# Patient Record
Sex: Male | Born: 1975
Health system: Southern US, Community
[De-identification: ages and names within clinical notes are randomized; demographics above are authoritative.]

## PROBLEM LIST (undated history)

## (undated) DIAGNOSIS — T7840XA Allergy, unspecified, initial encounter: Secondary | ICD-10-CM

## (undated) DIAGNOSIS — N35919 Unspecified urethral stricture, male, unspecified site: Secondary | ICD-10-CM

## (undated) DIAGNOSIS — F419 Anxiety disorder, unspecified: Secondary | ICD-10-CM

## (undated) HISTORY — DX: Allergy, unspecified, initial encounter: T78.40XA

## (undated) HISTORY — DX: Unspecified urethral stricture, male, unspecified site: N35.919

## (undated) HISTORY — DX: Anxiety disorder, unspecified: F41.9

---

## 2004-07-14 ENCOUNTER — Ambulatory Visit: Payer: Self-pay | Admitting: Internal Medicine

## 2004-08-24 ENCOUNTER — Ambulatory Visit (HOSPITAL_BASED_OUTPATIENT_CLINIC_OR_DEPARTMENT_OTHER): Admission: RE | Admit: 2004-08-24 | Discharge: 2004-08-24 | Payer: Self-pay | Admitting: Urology

## 2006-09-04 HISTORY — PX: URETHROPLASTY: SHX499

## 2006-09-04 HISTORY — PX: OTHER SURGICAL HISTORY: SHX169

## 2015-04-29 ENCOUNTER — Encounter: Payer: Self-pay | Admitting: Physician Assistant

## 2015-04-29 ENCOUNTER — Ambulatory Visit (INDEPENDENT_AMBULATORY_CARE_PROVIDER_SITE_OTHER): Payer: BLUE CROSS/BLUE SHIELD | Admitting: Physician Assistant

## 2015-04-29 VITALS — BP 126/74 | HR 96 | Temp 98.6°F | Resp 18 | Ht 73.0 in | Wt 195.0 lb

## 2015-04-29 DIAGNOSIS — F4321 Adjustment disorder with depressed mood: Secondary | ICD-10-CM

## 2015-04-29 DIAGNOSIS — F411 Generalized anxiety disorder: Secondary | ICD-10-CM | POA: Diagnosis not present

## 2015-04-29 MED ORDER — FINASTERIDE 5 MG PO TABS: 5.0000 mg | ORAL_TABLET | Freq: Every day | ORAL | Status: DC

## 2015-04-29 MED ORDER — ALPRAZOLAM 0.5 MG PO TABS: 0.5000 mg | ORAL_TABLET | Freq: Two times a day (BID) | ORAL | Status: DC | PRN

## 2015-04-29 NOTE — Progress Notes (Signed)
Assessment and Plan: Anxiety/stress reaction- will give xanax 0.5mg  #60 NR, follow up 1 month, may start medication at that time for OCD, call the office in 1-2 weeks or any issues.     HPI 39 y.o.male presents as new patient for anxiety, referred by Berdie Ogren. Wife has been diagnosed with terminal cancer and is in hospice, he has been 3 months at hospice or [facility] hospital. With the stress of this he is having a hard time sleeping, having panic attacks with racing heart beats, SOB, sweating. He has some OCD tendencies but has never had treatment. He is not interested in an every day pill but would like something for the panic attacks. 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   No past medical history on file.   Allergies not on file    No current outpatient prescriptions on file prior to visit.   No current facility-administered medications on file prior to visit.    ROS: all negative except above.   Physical Exam: Filed Weights   04/29/15 1535  Weight: 195 lb (88.451 kg)   BP 126/74 mmHg  Pulse 96  Temp(Src) 98.6 F (37 C) (Temporal)  Resp 18  Ht 6\' 1"  (1.854 m)  Wt 195 lb (88.451 kg)  BMI 25.73 kg/m2 General Appearance: Well nourished, in no apparent distress. Eyes: PERRLA, EOMs, conjunctiva no swelling or erythema Sinuses: No Frontal/maxillary tenderness ENT/Mouth: Ext aud canals clear, TMs without erythema, bulging. No erythema, swelling, or exudate on post pharynx.  Tonsils not swollen or erythematous. Hearing normal.  Neck: Supple, thyroid normal.  Respiratory: Respiratory effort normal, BS equal bilaterally without rales, rhonchi, wheezing or stridor.  Cardio: RRR with no MRGs. Brisk peripheral pulses without edema.  Abdomen: Soft, + BS.  Non tender, no guarding, rebound, hernias, masses. Lymphatics: Non tender without lymphadenopathy.  Musculoskeletal: Full ROM, 5/5 strength, normal gait.  Skin: Warm, dry without  rashes, lesions, ecchymosis.  Neuro: Cranial nerves intact. Normal muscle tone, no cerebellar symptoms. Sensation intact.  Psych: Awake and oriented X 3, normal affect, Insight and Judgment appropriate.     Vicie Mutters, PA-C 3:45 PM [facility]

## 2015-05-03 ENCOUNTER — Telehealth: Payer: Self-pay | Admitting: Physician Assistant

## 2015-05-03 MED ORDER — SERTRALINE HCL 50 MG PO TABS: 50.0000 mg | ORAL_TABLET | Freq: Every day | ORAL | Status: DC

## 2015-05-03 NOTE — Telephone Encounter (Signed)
Called to let us know that 2 of the 0.5mg  xanax helped with sleep. And he is interested in trying a low dose of zoloft to help with his anxiety during the day. Will send in zoloft 50mg  for him. Follow up 1 month

## 2015-05-18 ENCOUNTER — Other Ambulatory Visit: Payer: Self-pay | Admitting: Physician Assistant

## 2015-05-18 MED ORDER — ALPRAZOLAM 1 MG PO TABS
ORAL_TABLET | ORAL | Status: DC
Start: 2015-05-18 — End: 2015-05-31

## 2015-05-31 ENCOUNTER — Ambulatory Visit (INDEPENDENT_AMBULATORY_CARE_PROVIDER_SITE_OTHER): Payer: BLUE CROSS/BLUE SHIELD | Admitting: Physician Assistant

## 2015-05-31 ENCOUNTER — Encounter: Payer: Self-pay | Admitting: Physician Assistant

## 2015-05-31 VITALS — BP 118/76 | HR 88 | Temp 97.5°F | Resp 16 | Ht 73.0 in | Wt 191.8 lb

## 2015-05-31 DIAGNOSIS — F411 Generalized anxiety disorder: Secondary | ICD-10-CM

## 2015-05-31 DIAGNOSIS — R3915 Urgency of urination: Secondary | ICD-10-CM | POA: Diagnosis not present

## 2015-05-31 DIAGNOSIS — G4700 Insomnia, unspecified: Secondary | ICD-10-CM

## 2015-05-31 MED ORDER — ALPRAZOLAM 1 MG PO TABS: ORAL_TABLET | ORAL | Status: DC

## 2015-05-31 MED ORDER — TAMSULOSIN HCL 0.4 MG PO CAPS: 0.4000 mg | ORAL_CAPSULE | Freq: Every day | ORAL | Status: DC

## 2015-05-31 MED ORDER — SERTRALINE HCL 100 MG PO TABS: 100.0000 mg | ORAL_TABLET | Freq: Every day | ORAL | Status: DC

## 2015-05-31 NOTE — Progress Notes (Signed)
Assessment and Plan: OCD- increase zoloft to 100, call the office if not helping will try celexa.  Insomnia- From [facility], stop beers, no liquids 2 hours before bed, drink 64 oz during the day, add on flomax at night, if dizzy will try myrebetriq.  Anxiety- increase zoloft, suggest counseling, xanax PRN.   Declines labs.   HPI 39 y.o.male presents for follow up. Wife has passed since last seen. He is not having panic attacks, has been on zoloft 50 and taking xanax PRN for anxiety and sleep. Took several at the funeral of the xanax but other wise 1 at night. He just got a call from hospice grief counselor today and will make an appointment to see someone.  He is still waking up in the middle of the night but states that he is getting up to urinate. He drink 1-2 beers before bed.  He has seen a urologist in the past and has obstructive symptoms and a surgery in the past.  Having worsening OCD still with zoloft 50mg , will have to check doors/stove etc.  Has obstructive surgery in the past and is getting up to urinate in the middle of night.    No past medical history on file.   Allergies not on file    Current Outpatient Prescriptions on File Prior to Visit  Medication Sig Dispense Refill  . ALPRAZolam (XANAX) 1 MG tablet 1/2-1 tablet as needed twice daily for anxiety or sleep. 60 tablet 0  . finasteride (PROPECIA) 1 MG tablet Take 1 mg by mouth daily.    . finasteride (PROSCAR) 5 MG tablet Take 1 tablet (5 mg total) by mouth daily. 30 tablet 6  . sertraline (ZOLOFT) 50 MG tablet Take 1 tablet (50 mg total) by mouth daily. 30 tablet 2   No current facility-administered medications on file prior to visit.    ROS: all negative except above.   Physical Exam: Filed Weights   05/31/15 1606  Weight: 191 lb 12.8 oz (87 kg)   BP 118/76 mmHg  Pulse 88  Temp(Src) 97.5 F (36.4 C)  Resp 16  Ht 6\' 1"  (1.854 m)  Wt 191 lb 12.8 oz (87 kg)  BMI 25.31 kg/m2 General Appearance: Well nourished, in  no apparent distress. Eyes: PERRLA, EOMs, conjunctiva no swelling or erythema Sinuses: No Frontal/maxillary tenderness ENT/Mouth: Ext aud canals clear, TMs without erythema, bulging. No erythema, swelling, or exudate on post pharynx.  Tonsils not swollen or erythematous. Hearing normal.  Neck: Supple, thyroid normal.  Respiratory: Respiratory effort normal, BS equal bilaterally without rales, rhonchi, wheezing or stridor.  Cardio: RRR with no MRGs. Brisk peripheral pulses without edema.  Abdomen: Soft, + BS.  Non tender, no guarding, rebound, hernias, masses. Lymphatics: Non tender without lymphadenopathy.  Musculoskeletal: Full ROM, 5/5 strength, normal gait.  Skin: Warm, dry without rashes, lesions, ecchymosis.  Neuro: Cranial nerves intact. Normal muscle tone, no cerebellar symptoms. Sensation intact.  Psych: Awake and oriented X 3, normal affect, Insight and Judgment appropriate.     Vicie Mutters, PA-C 4:29 PM [facility]

## 2015-05-31 NOTE — Patient Instructions (Addendum)
Switch the zoloft to at night, take 50 mg tonight, and 100 tomorrow.   Drink AT LEAST 64 oz of water during the DAY  Do not drink anything 1-2 hours before bed  Continue the xanax PRN  Try the flomax at night and if this does not work OR if you get dizzy stop it and call me and I will leave samples for you to pick up  Also here is some information about good sleep hygiene.   Insomnia Insomnia is frequent trouble falling and/or staying asleep. Insomnia can be a long term problem or a short term problem. Both are common. Insomnia can be a short term problem when the wakefulness is related to a certain stress or worry. Long term insomnia is often related to ongoing stress during waking hours and/or poor sleeping habits. Overtime, sleep deprivation itself can make the problem worse. Every little thing feels more severe because you are overtired and your ability to cope is decreased. CAUSES   Stress, anxiety, and depression.  Poor sleeping habits.  Distractions such as TV in the bedroom.  Naps close to bedtime.  Engaging in emotionally charged conversations before bed.  Technical reading before sleep.  Alcohol and other sedatives. They may make the problem worse. They can hurt normal sleep patterns and normal dream activity.  Stimulants such as caffeine for several hours prior to bedtime.  Pain syndromes and shortness of breath can cause insomnia.  Exercise late at night.  Changing time zones may cause sleeping problems (jet lag). It is sometimes helpful to have someone observe your sleeping patterns. They should look for periods of not breathing during the night (sleep apnea). They should also look to see how long those periods last. If you live alone or observers are uncertain, you can also be observed at a sleep clinic where your sleep patterns will be professionally monitored. Sleep apnea requires a checkup and treatment. Give your caregivers your medical history. Give your  caregivers observations your family has made about your sleep.  SYMPTOMS   Not feeling rested in the morning.  Anxiety and restlessness at bedtime.  Difficulty falling and staying asleep. TREATMENT   Your caregiver may prescribe treatment for an underlying medical disorders. Your caregiver can give advice or help if you are using alcohol or other drugs for self-medication. Treatment of underlying problems will usually eliminate insomnia problems.  Medications can be prescribed for short time use. They are generally not recommended for lengthy use.  Over-the-counter sleep medicines are not recommended for lengthy use. They can be habit forming.  You can promote easier sleeping by making lifestyle changes such as:  Using relaxation techniques that help with breathing and reduce muscle tension.  Exercising earlier in the day.  Changing your diet and the time of your last meal. No night time snacks.  Establish a regular time to go to bed.  Counseling can help with stressful problems and worry.  Soothing music and white noise may be helpful if there are background noises you cannot remove.  Stop tedious detailed work at least one hour before bedtime. HOME CARE INSTRUCTIONS   Keep a diary. Inform your caregiver about your progress. This includes any medication side effects. See your caregiver regularly. Take note of:  Times when you are asleep.  Times when you are awake during the night.  The quality of your sleep.  How you feel the next day. This information will help your caregiver care for you.  Get out of bed if  you are still awake after 15 minutes. Read or do some quiet activity. Keep the lights down. Wait until you feel sleepy and go back to bed.  Keep regular sleeping and waking hours. Avoid naps.  Exercise regularly.  Avoid distractions at bedtime. Distractions include watching television or engaging in any intense or detailed activity like attempting to balance  the household checkbook.  Develop a bedtime ritual. Keep a familiar routine of bathing, brushing your teeth, climbing into bed at the same time each night, listening to soothing music. Routines increase the success of falling to sleep faster.  Use relaxation techniques. This can be using breathing and muscle tension release routines. It can also include visualizing peaceful scenes. You can also help control troubling or intruding thoughts by keeping your mind occupied with boring or repetitive thoughts like the old concept of counting sheep. You can make it more creative like imagining planting one beautiful flower after another in your backyard garden.  During your day, work to eliminate stress. When this is not possible use some of the previous suggestions to help reduce the anxiety that accompanies stressful situations. MAKE SURE YOU:   Understand these instructions.  Will watch your condition.  Will get help right away if you are not doing well or get worse. Document Released: 08/18/2000 Document Revised: 11/13/2011 Document Reviewed: 09/18/2007 [facility] Patient Information 2015 [place], [place]. This information is not intended to replace advice given to you by your health care provider. Make sure you discuss any questions you have with your health care provider.

## 2015-06-04 ENCOUNTER — Telehealth: Payer: Self-pay | Admitting: Physician Assistant

## 2015-06-04 MED ORDER — SCOPOLAMINE 1 MG/3DAYS TD PT72: 1.0000 | MEDICATED_PATCH | TRANSDERMAL | Status: DC

## 2015-06-04 MED ORDER — MECLIZINE HCL 32 MG PO TABS: 32.0000 mg | ORAL_TABLET | Freq: Three times a day (TID) | ORAL | Status: DC | PRN

## 2015-06-04 MED ORDER — ONDANSETRON HCL 4 MG PO TABS: 4.0000 mg | ORAL_TABLET | Freq: Three times a day (TID) | ORAL | Status: DC | PRN

## 2015-06-04 NOTE — Telephone Encounter (Signed)
Patient going deep sea fishing, sent in patches but if too expensive get the zofran for nausea and meclizine for sea sickness.

## 2015-06-04 NOTE — Telephone Encounter (Signed)
LVM for pt to pick up Rx.

## 2015-06-29 ENCOUNTER — Other Ambulatory Visit: Payer: Self-pay | Admitting: Physician Assistant

## 2015-06-29 MED ORDER — TRAZODONE HCL 50 MG PO TABS: ORAL_TABLET | ORAL | Status: DC

## 2015-08-11 ENCOUNTER — Ambulatory Visit (INDEPENDENT_AMBULATORY_CARE_PROVIDER_SITE_OTHER): Payer: BLUE CROSS/BLUE SHIELD | Admitting: Physician Assistant

## 2015-08-11 ENCOUNTER — Encounter: Payer: Self-pay | Admitting: Physician Assistant

## 2015-08-11 VITALS — BP 110/60 | HR 91 | Temp 98.1°F | Resp 16 | Ht 72.5 in | Wt 185.0 lb

## 2015-08-11 DIAGNOSIS — F4321 Adjustment disorder with depressed mood: Secondary | ICD-10-CM

## 2015-08-11 DIAGNOSIS — G4700 Insomnia, unspecified: Secondary | ICD-10-CM

## 2015-08-11 DIAGNOSIS — L219 Seborrheic dermatitis, unspecified: Secondary | ICD-10-CM

## 2015-08-11 DIAGNOSIS — N138 Other obstructive and reflux uropathy: Secondary | ICD-10-CM

## 2015-08-11 DIAGNOSIS — D225 Melanocytic nevi of trunk: Secondary | ICD-10-CM

## 2015-08-11 DIAGNOSIS — Z0000 Encounter for general adult medical examination without abnormal findings: Secondary | ICD-10-CM | POA: Diagnosis not present

## 2015-08-11 DIAGNOSIS — E559 Vitamin D deficiency, unspecified: Secondary | ICD-10-CM

## 2015-08-11 DIAGNOSIS — S86811A Strain of other muscle(s) and tendon(s) at lower leg level, right leg, initial encounter: Secondary | ICD-10-CM

## 2015-08-11 DIAGNOSIS — Z79899 Other long term (current) drug therapy: Secondary | ICD-10-CM | POA: Diagnosis not present

## 2015-08-11 DIAGNOSIS — Z13220 Encounter for screening for lipoid disorders: Secondary | ICD-10-CM

## 2015-08-11 DIAGNOSIS — Z1389 Encounter for screening for other disorder: Secondary | ICD-10-CM

## 2015-08-11 DIAGNOSIS — N401 Enlarged prostate with lower urinary tract symptoms: Secondary | ICD-10-CM

## 2015-08-11 DIAGNOSIS — R5383 Other fatigue: Secondary | ICD-10-CM

## 2015-08-11 DIAGNOSIS — F411 Generalized anxiety disorder: Secondary | ICD-10-CM

## 2015-08-11 MED ORDER — SILDENAFIL CITRATE 100 MG PO TABS: 100.0000 mg | ORAL_TABLET | ORAL | Status: DC | PRN

## 2015-08-11 MED ORDER — TRAZODONE HCL 150 MG PO TABS: ORAL_TABLET | ORAL | Status: DC

## 2015-08-11 MED ORDER — MELOXICAM 15 MG PO TABS: ORAL_TABLET | ORAL | Status: DC

## 2015-08-11 MED ORDER — CLOBETASOL PROPIONATE 0.05 % EX SHAM: MEDICATED_SHAMPOO | CUTANEOUS | Status: DC

## 2015-08-11 NOTE — Patient Instructions (Addendum)
Seborrheic Dermatitis  USE baby shampoo on your eyes Can also try over the counter hydrocortisone cream  Will send in a shampoo for your scalp, can use 2 x a week.   Seborrheic dermatitis involves pink or red skin with greasy, flaky scales. It usually occurs on the scalp, and it is often called dandruff. This condition may also affect the eyebrows, nose, ears, chest, and the bearded area of men's faces. It often occurs where skin has more oil (sebaceous) glands. It may come and go for no known reason, and it is often long-lasting (chronic). CAUSES The cause is not known. RISK FACTORS This condition is more like to develop in:  People who are stressed or tired.  People who have skin conditions, such as acne.  People who have certain conditions, such as:  HIV (human immunodeficiency virus).  AIDS (acquired immunodeficiency syndrome).  Parkinson disease.  An eating disorder.  Stroke.  Depression.  Epilepsy.  Alcoholism.  People who live in places that have extreme weather.  People who have a family history of seborrheic dermatitis.  People who use skin creams that are made with alcohol.  People who are 109-65 years old.  People who take certain medicines. SYMPTOMS Symptoms of this condition include:  Thick scales on the scalp.  Redness on the face or in the armpits.  Skin that is flaky. The flakes may be white or yellow.  Skin that seems oily or dry but is not helped with moisturizers.  Itching or burning in the affected areas. DIAGNOSIS This condition is diagnosed with a medical history and physical exam. A sample of your skin may be tested (skin biopsy). You may need to see a skin specialist (dermatologist). TREATMENT There is no cure for this condition, but treatment can help to manage the symptoms. Treatment may include:  Cortisone (steroid) ointments, creams, and lotions.  Over-the-counter or prescription shampoos. HOME CARE INSTRUCTIONS  Apply  over-the-counter and prescription medicines only as told by your health care provider.  Keep all follow-up visits as told by your health care provider. This is important.  Try to reduce your stress, such as with yoga or mediation. If you need help to reduce stress, ask your health care provider.  Shower or bathe as told by your health care provider.  Use any medicated shampoos as told by your health care provider. SEEK MEDICAL CARE IF:  Your symptoms do not improve with treatment.  Your symptoms get worse.  You have new symptoms.   This information is not intended to replace advice given to you by your health care provider. Make sure you discuss any questions you have with your health care provider.   Document Released: 08/21/2005 Document Revised: 05/12/2015 Document Reviewed: 01/06/2015 Elsevier Interactive Patient Education 2016 [employer]  Patellar Tendinitis With Rehab Tendinitis is inflammation of a tendon. Tendonitis of the tendon below the kneecap (patella) is known as patellar tendonitis. Patellar tendonitis is also called jumper's knee. Jumper's knee is a common cause of pain below the kneecap (infrapatellar). Jumper's knee may involve a tear (strain) in the ligament. Strains are classified into three categories. Grade 1 strains cause pain, but the tendon is not lengthened. Grade 2 strains include a lengthened ligament, due to the ligament being stretched or partially ruptured. With grade 2 strains there is still function, although function may be decreased. Grade 3 strains involve a complete tear of the tendon or muscle, and function is usually impaired. Patellar tendon strains are usually grade 1 or 2.  SYMPTOMS   Pain, tenderness, swelling, warmth, or redness over the patellar tendon (just below the kneecap).  Pain and loss of strength (sometimes), with forcefully straightening the knee (especially when jumping or rising from a seated or squatting position), or bending  the knee completely (squatting or kneeling).  Crackling sound (crepitation) when the tendon is moved or touched. CAUSES  Patellar tendonitis is caused by injury to the patellar tendon. The inflammation is the body's healing response. Common causes of injury include:  Stress from a sudden increase in intensity, frequency, or duration of training.  Overuse of the thigh muscles (quadriceps) and patellar tendon.  Direct hit (trauma) to the knee or patellar tendon. RISK INCREASES WITH:  Sports that require sudden, explosive quadriceps contraction, such as jumping, quick starts, or kicking.  Running sports, especially running down hills.  Poor strength and flexibility of the thigh and knee.  Flat feet. PREVENTION  Warm up and stretch properly before activity.  Allow for adequate recovery between workouts.  Maintain physical fitness:  Strength, flexibility, and endurance.  Cardiovascular fitness.  Protect the knee joint with taping, protective strapping, bracing, or elastic compression bandage.  Wear arch supports (orthotics). PROGNOSIS  If treated properly, patellar tendonitis usually heals within 6 weeks.  RELATED COMPLICATIONS   Longer healing time if not properly treated or if not given enough time to heal.  Recurring symptoms if activity is resumed too soon, with overuse, with a direct blow, or when using poor technique.  If untreated, tendon rupture requiring surgery. TREATMENT Treatment first involves the use of ice and medicine to reduce pain and inflammation. The use of strengthening and stretching exercises may help reduce pain with activity. These exercises may be performed at home or with a therapist. Serious cases of tendonitis may require restraining the knee for 10 to 14 days to prevent stress on the tendon and to promote healing. Crutches may be used (uncommon) until you can walk without a limp. For cases in which nonsurgical treatment is unsuccessful, surgery  may be advised to remove the inflamed tendon lining (sheath). Surgery is rare, and is only advised after at least 6 months of nonsurgical treatment. MEDICATION   If pain medicine is needed, nonsteroidal anti-inflammatory medicines (aspirin and ibuprofen), or other minor pain relievers (acetaminophen), are often advised.  Do not take pain medicine for 7 days before surgery.  Prescription pain relievers may be given if your caregiver thinks they are needed. Use only as directed and only as much as you need. HEAT AND COLD  Cold treatment (icing) should be applied for 10 to 15 minutes every 2 to 3 hours for inflammation and pain, and immediately after activity that aggravates your symptoms. Use ice packs or an ice massage.  Heat treatment may be used before performing stretching and strengthening activities prescribed by your caregiver, physical therapist, or athletic trainer. Use a heat pack or a warm water soak. SEEK MEDICAL CARE IF:  Symptoms get worse or do not improve in 2 weeks, despite treatment.  New, unexplained symptoms develop. (Drugs used in treatment may produce side effects.) EXERCISES RANGE OF MOTION (ROM) AND STRETCHING EXERCISES - Patellar Tendinitis (Jumper's Knee) These are some of the initial exercises with which you may start your rehabilitation program, until you see your caregiver again or until your symptoms are resolved. Remember:   Flexible tissue is more tolerant of the stresses placed on it during activities.  Each stretch should be held for 20 to 30 seconds.  A gentle stretching  sensation should be felt. STRETCH - Hamstrings, Supine  Lie on your back. Loop a belt or towel over the ball of your right / left foot.  Straighten your right / left knee and slowly pull on the belt to raise your leg. Do not allow the right / left knee to bend. Keep your opposite leg flat on the floor.  Raise the leg until you feel a gentle stretch behind your right / left knee or  thigh. Hold this position for __________ seconds. Repeat __________ times. Complete this stretch __________ times per day.  STRETCH - Hamstrings, Doorway  Lie on your back with your right / left leg extended and resting on the wall, and the opposite leg flat on the ground through the door. At first, position your bottom farther away from the wall.  Keep your right / left knee straight. If you feel a stretch behind your knee or thigh, hold this position for __________ seconds.  If you do not feel a stretch, scoot your bottom closer to the door, and hold __________ seconds. Repeat __________ times. Complete this stretch __________ times per day.  STRETCH - Hamstrings, Standing  Stand or sit and extend your right / left leg, placing your foot on a chair or foot stool.  Keep a slight arch in your low back and your hips straight forward.  Lead with your chest and lean forward at the waist until you feel a gentle stretch in the back of your right / left knee or thigh. (When done correctly, this exercise requires leaning only a small distance.)  Hold this position for __________ seconds. Repeat __________ times. Complete this stretch __________ times per day. STRETCH - Adductors, Lunge  While standing, spread your legs, with your right / left leg behind you.  Lean away from your right / left leg by bending your opposite knee. You may rest your hands on your thigh for balance.  You should feel a stretch in your right / left inner thigh. Hold for __________ seconds. Repeat __________ times. Complete this exercise __________ times per day.  STRENGTHENING EXERCISES - Patellar Tendinitis (Jumper's Knee) These exercises may help you when beginning to rehabilitate your injury. They may resolve your symptoms with or without further involvement from your physician, physical therapist or athletic trainer. While completing these exercises, remember:   Muscles can gain both the endurance and the strength  needed for everyday activities through controlled exercises.  Complete these exercises as instructed by your physician, physical therapist or athletic trainer. Increase the resistance and repetitions only as guided by your caregiver. STRENGTH - Quadriceps, Isometrics  Lie on your back with your right / left leg extended and your opposite knee bent.  Gradually tense the muscles in the front of your right / left thigh. You should see either your kneecap slide up toward your hip or increased dimpling just above the knee. This motion will push the back of the knee down toward the floor, mat, or bed on which you are lying.  Hold the muscle as tight as you can, without increasing your pain, for __________ seconds.  Relax the muscles slowly and completely in between each repetition. Repeat __________ times. Complete this exercise __________ times per day.  STRENGTH - Quadriceps, Short Arcs  Lie on your back. Place a __________ inch towel roll under your right / left knee, so that the knee bends slightly.  Raise only your lower leg by tightening the muscles in the front of your thigh. Do  not allow your thigh to rise.  Hold this position for __________ seconds. Repeat __________ times. Complete this exercise __________ times per day.  OPTIONAL ANKLE WEIGHTS: Begin with ____________________, but DO NOT exceed ____________________. Increase in 1 pound/ 0.5 kilogram increments. STRENGTH - Quadriceps, Straight Leg Raises  Quality counts! Watch for signs that the quadriceps muscle is working, to be sure you are strengthening the correct muscles and not "cheating" by substituting with healthier muscles.  Lay on your back with your right / left leg extended and your opposite knee bent.  Tense the muscles in the front of your right / left thigh. You should see either your kneecap slide up or increased dimpling just above the knee. Your thigh may even shake a bit.  Tighten these muscles even more and  raise your leg 4 to 6 inches off the floor. Hold for __________ seconds.  Keeping these muscles tense, lower your leg.  Relax the muscles slowly and completely between each repetition. Repeat __________ times. Complete this exercise __________ times per day.  STRENGTH - Quadriceps, Squats  Stand in a door frame so that your feet and knees are in line with the frame.  Use your hands for balance, not support, on the frame.  Slowly lower your weight, bending at the hips and knees. Keep your lower legs upright so that they are parallel with the door frame. Squat only within the range that does not increase your knee pain. Never let your hips drop below your knees.  Slowly return upright, pushing with your legs, not pulling with your hands. Repeat __________ times. Complete this exercise __________ times per day.  STRENGTH - Quadriceps, Step-Downs  Stand on the edge of a step stool or stair. Be prepared to use a countertop or wall for balance, if needed.  Keeping your right / left knee directly over the middle of your foot, slowly touch your opposite heel to the floor or lower step. Do not go all the way to the floor if your knee pain increases; just go as far as you can without increased discomfort. Use your right / left leg muscles, not gravity to lower your body weight.  Slowly push your body weight back up to the starting position. Repeat __________ times. Complete this exercise __________ times per day.    This information is not intended to replace advice given to you by your health care provider. Make sure you discuss any questions you have with your health care provider.   Document Released: 08/21/2005 Document Revised: 01/05/2015 Document Reviewed: 12/03/2008 Elsevier Interactive Patient Education [employer].

## 2015-08-11 NOTE — Progress Notes (Signed)
Complete Physical  Assessment and Plan: 1. Grief reaction Declines meds at this time  2. Generalized anxiety disorder Declines meds at this time  stress management techniques discussed, increase water, good sleep hygiene discussed, increase exercise, and increase veggies.  - TSH  3. Insomnia Increase trazodone to 150mg , discussed healthy sleeping habits  4. Strain of right patellar tendon, initial encounter RICE, mobic, if not better PT or ortho  5. Seborrheic dermatitis of scalp Hydrocortisone cream for eyes and shampoo sent in  6. Screening for blood or protein in urine - Urinalysis, Routine w reflex microscopic (not at [facility]) - Microalbumin / creatinine urine ratio  7. Vitamin D deficiency - VITAMIN D 25 Hydroxy (Vit-D Deficiency, Fractures)  8. Medication management - CBC with Differential/Platelet - BASIC METABOLIC PANEL WITH GFR - Hepatic function panel - Magnesium  9. Screening cholesterol level - Lipid panel  10. Other fatigue Versus not sleeping, versus grief reaction - Testosterone  11. BPH with obstruction/lower urinary tract symptoms - PSA  12. Atypical nevus of abdominal wall Need to schedule removal.   . Discussed med's effects and SE's. Screening labs and tests as requested with regular follow-up as recommended. Over 40 minutes of exam, counseling, chart review and critical decision making was performed   HPI  This very nice 39 y.o.male presents for complete physical.  Patient has no major health issues.  Patient reports no complaints at this time.  He does workout.  Patient moved here recently, started as new patient, wife was in hospice at the time but has recently passed from ovarian cancer. He has had increase stress, grieving due to this. He also has described OCD type behavior, was put on zoloft but has stopped every thing for 2-3 weeks. He states he is still not sleeping, will sleep 3-4 hours. Trazodone will take occ, has not tried 2 pills.  Very rarely takes xanax.  Has some dryness around eyes and hands, happens during winter.  Medial right knee pain, no pain during running, but has pain afterwards. + popping/clicking but no catching, does not give out on him. Occ pain with going up and down stairs.    Current Medications:  Current Outpatient Prescriptions on File Prior to Visit  Medication Sig Dispense Refill  . ALPRAZolam (XANAX) 1 MG tablet 1/2-1 tablet as needed twice daily for anxiety or sleep. 60 tablet 1  . finasteride (PROPECIA) 1 MG tablet Take 1 mg by mouth daily.    . finasteride (PROSCAR) 5 MG tablet Take 1 tablet (5 mg total) by mouth daily. 30 tablet 6  . sertraline (ZOLOFT) 100 MG tablet Take 1 tablet (100 mg total) by mouth daily. 30 tablet 2  . traZODone (DESYREL) 50 MG tablet 1/2-2 pills at night for sleep 60 tablet 0   No current facility-administered medications on file prior to visit.   Health Maintenance:    TD/TDAP: declines Influenza: declines Pneumovax: N/A Prevnar 13: N/A  Sexually Active: no   Allergies: Allergies not on file Medical History: No past medical history on file. Surgical History: No past surgical history on file. Family History: No family history on file. Social History:  Social History  Substance Use Topics  . Smoking status: Never Smoker   . Smokeless tobacco: Not on file  . Alcohol Use: Not on file   Review of Systems: Review of Systems  Constitutional: Positive for malaise/fatigue. Negative for fever, chills, weight loss and diaphoresis.  HENT: Negative.   Eyes: Negative.   Respiratory: Negative.  Cardiovascular: Negative.   Gastrointestinal: Negative.   Genitourinary: Positive for urgency and frequency. Negative for dysuria, hematuria and flank pain.  Musculoskeletal: Positive for joint pain (left knee). Negative for myalgias, back pain, falls and neck pain.  Skin: Negative.   Neurological: Negative.  Negative for weakness.  Endo/Heme/Allergies: Negative.    Psychiatric/Behavioral: Positive for depression. Negative for suicidal ideas, hallucinations, memory loss and substance abuse. The patient is nervous/anxious and has insomnia.     Physical Exam: Estimated body mass index is 24.73 kg/(m^2) as calculated from the following:   Height as of this encounter: 6' 0.5" (1.842 m).   Weight as of this encounter: 185 lb (83.915 kg). BP 110/60 mmHg  Pulse 91  Temp(Src) 98.1 F (36.7 C) (Temporal)  Resp 16  Ht 6' 0.5" (1.842 m)  Wt 185 lb (83.915 kg)  BMI 24.73 kg/m2  SpO2 96% General Appearance: Well nourished, in no apparent distress.  Eyes: PERRLA, EOMs, conjunctiva no swelling or erythema, normal fundi and vessels.  Sinuses: No Frontal/maxillary tenderness  ENT/Mouth: Ext aud canals clear, normal light reflex with TMs without erythema, bulging. Good dentition. No erythema, swelling, or exudate on post pharynx. Tonsils not swollen or erythematous. Hearing normal.  Neck: Supple, thyroid normal. No bruits  Respiratory: Respiratory effort normal, BS equal bilaterally without rales, rhonchi, wheezing or stridor.  Cardio: RRR without murmurs, rubs or gallops. Brisk peripheral pulses without edema.  Chest: symmetric, with normal excursions and percussion.  Abdomen: Soft, nontender, no guarding, rebound, hernias, masses, or organomegaly.  Lymphatics: Non tender without lymphadenopathy.  Genitourinary: defer Musculoskeletal: Full ROM all peripheral extremities,5/5 strength, and normal gait. + tenderness at left patellar tendon/quad Skin: Several atypical nevus, left AB with abnormal dark nevus, irreg borders, will schedule removal. Warm, dry without rashes, lesions, ecchymosis. Neuro: Cranial nerves intact, reflexes equal bilaterally. Normal muscle tone, no cerebellar symptoms. Sensation intact.  Psych: Awake and oriented X 3, normal affect, Insight and Judgment appropriate.   EKG: defer  Vicie Mutters 3:23 PM [facility]

## 2015-08-12 ENCOUNTER — Telehealth: Payer: Self-pay

## 2015-08-12 LAB — LIPID PANEL
CHOL/HDL RATIO: 2 ratio (ref <=5.0)
Cholesterol: 145 mg/dL (ref 125–200)
HDL: 73 mg/dL (ref >=40)
LDL Cholesterol: 61 mg/dL (ref <130)
Triglycerides: 53 mg/dL (ref <150)
VLDL: 11 mg/dL (ref <30)

## 2015-08-12 LAB — URINALYSIS, ROUTINE W REFLEX MICROSCOPIC
Bilirubin Urine: NEGATIVE
GLUCOSE, UA: NEGATIVE
HGB URINE DIPSTICK: NEGATIVE
KETONES UR: NEGATIVE
NITRITE: NEGATIVE
PH: 7 (ref 5.0–8.0)
Protein, ur: NEGATIVE
Specific Gravity, Urine: 1.012 (ref 1.001–1.035)

## 2015-08-12 LAB — MICROALBUMIN / CREATININE URINE RATIO
Creatinine, Urine: 67 mg/dL (ref 20–370)
Microalb Creat Ratio: 3 mcg/mg creat (ref <30)
Microalb, Ur: 0.2 mg/dL

## 2015-08-12 LAB — URINALYSIS, MICROSCOPIC ONLY
Bacteria, UA: NONE SEEN [HPF]
CASTS: NONE SEEN [LPF]
Crystals: NONE SEEN [HPF]
RBC / HPF: NONE SEEN RBC/HPF (ref <=2)
Yeast: NONE SEEN [HPF]

## 2015-08-12 LAB — CBC WITH DIFFERENTIAL/PLATELET
BASOS ABS: 0.1 10*3/uL (ref 0.0–0.1)
Basophils Relative: 1 % (ref 0–1)
Eosinophils Absolute: 0.1 10*3/uL (ref 0.0–0.7)
Eosinophils Relative: 1 % (ref 0–5)
HEMATOCRIT: 46.4 % (ref 39.0–52.0)
HEMOGLOBIN: 15.3 g/dL (ref 13.0–17.0)
LYMPHS ABS: 2.5 10*3/uL (ref 0.7–4.0)
LYMPHS PCT: 24 % (ref 12–46)
MCH: 29.4 pg (ref 26.0–34.0)
MCHC: 33 g/dL (ref 30.0–36.0)
MCV: 89.2 fL (ref 78.0–100.0)
MONOS PCT: 10 % (ref 3–12)
MPV: 10 fL (ref 8.6–12.4)
Monocytes Absolute: 1.1 10*3/uL — ABNORMAL HIGH (ref 0.1–1.0)
NEUTROS ABS: 6.8 10*3/uL (ref 1.7–7.7)
NEUTROS PCT: 64 % (ref 43–77)
PLATELETS: 293 10*3/uL (ref 150–400)
RBC: 5.2 MIL/uL (ref 4.22–5.81)
RDW: 12.7 % (ref 11.5–15.5)
WBC: 10.6 10*3/uL — ABNORMAL HIGH (ref 4.0–10.5)

## 2015-08-12 LAB — BASIC METABOLIC PANEL WITH GFR
BUN: 18 mg/dL (ref 7–25)
CHLORIDE: 101 mmol/L (ref 98–110)
CO2: 25 mmol/L (ref 20–31)
Calcium: 9.1 mg/dL (ref 8.6–10.3)
Creat: 1.16 mg/dL (ref 0.60–1.35)
GFR, Est African American: >89 mL/min (ref >=60)
GFR, Est Non African American: 78 mL/min (ref >=60)
GLUCOSE: 76 mg/dL (ref 65–99)
POTASSIUM: 4.2 mmol/L (ref 3.5–5.3)
Sodium: 138 mmol/L (ref 135–146)

## 2015-08-12 LAB — HEPATIC FUNCTION PANEL
ALBUMIN: 4.2 g/dL (ref 3.6–5.1)
ALK PHOS: 53 U/L (ref 40–115)
ALT: 23 U/L (ref 9–46)
AST: 27 U/L (ref 10–40)
BILIRUBIN TOTAL: 0.5 mg/dL (ref 0.2–1.2)
Bilirubin, Direct: 0.1 mg/dL (ref <=0.2)
Indirect Bilirubin: 0.4 mg/dL (ref 0.2–1.2)
Total Protein: 6.8 g/dL (ref 6.1–8.1)

## 2015-08-12 LAB — PSA: PSA: 0.02 ng/mL (ref <=4.00)

## 2015-08-12 LAB — TESTOSTERONE: Testosterone: 688 ng/dL (ref 300–890)

## 2015-08-12 LAB — VITAMIN D 25 HYDROXY (VIT D DEFICIENCY, FRACTURES): Vit D, 25-Hydroxy: 28 ng/mL — ABNORMAL LOW (ref 30–100)

## 2015-08-12 LAB — TSH: TSH: 1.62 u[IU]/mL (ref 0.350–4.500)

## 2015-08-12 LAB — MAGNESIUM: MAGNESIUM: 2 mg/dL (ref 1.5–2.5)

## 2015-08-12 NOTE — Telephone Encounter (Signed)
Pt states he will try and buy viagra online and maybe get it at a cheaper cost.

## 2015-08-13 ENCOUNTER — Other Ambulatory Visit: Payer: Self-pay | Admitting: Physician Assistant

## 2015-08-13 MED ORDER — SULFAMETHOXAZOLE-TRIMETHOPRIM 800-160 MG PO TABS: 1.0000 | ORAL_TABLET | Freq: Two times a day (BID) | ORAL | Status: DC

## 2015-08-13 MED ORDER — SILDENAFIL CITRATE 100 MG PO TABS: 100.0000 mg | ORAL_TABLET | Freq: Every day | ORAL | Status: DC | PRN

## 2015-08-16 ENCOUNTER — Other Ambulatory Visit: Payer: Self-pay | Admitting: Physician Assistant

## 2015-08-16 MED ORDER — SILDENAFIL CITRATE 100 MG PO TABS: 100.0000 mg | ORAL_TABLET | Freq: Every day | ORAL | Status: DC | PRN

## 2015-08-25 ENCOUNTER — Other Ambulatory Visit: Payer: Self-pay | Admitting: Physician Assistant

## 2015-08-25 MED ORDER — TADALAFIL 20 MG PO TABS: 20.0000 mg | ORAL_TABLET | Freq: Every day | ORAL | Status: DC | PRN

## 2015-08-26 ENCOUNTER — Ambulatory Visit: Payer: Self-pay | Admitting: Physician Assistant

## 2015-09-13 ENCOUNTER — Encounter: Payer: Self-pay | Admitting: Physician Assistant

## 2015-09-13 ENCOUNTER — Other Ambulatory Visit: Payer: Self-pay

## 2015-09-16 ENCOUNTER — Encounter: Payer: Self-pay | Admitting: Physician Assistant

## 2015-09-16 ENCOUNTER — Ambulatory Visit (INDEPENDENT_AMBULATORY_CARE_PROVIDER_SITE_OTHER): Payer: BLUE CROSS/BLUE SHIELD | Admitting: Physician Assistant

## 2015-09-16 VITALS — BP 108/74 | HR 64 | Temp 98.6°F | Resp 16 | Ht 72.0 in | Wt 185.0 lb

## 2015-09-16 DIAGNOSIS — D239 Other benign neoplasm of skin, unspecified: Secondary | ICD-10-CM | POA: Diagnosis not present

## 2015-09-16 DIAGNOSIS — D229 Melanocytic nevi, unspecified: Secondary | ICD-10-CM

## 2015-09-16 DIAGNOSIS — F4321 Adjustment disorder with depressed mood: Secondary | ICD-10-CM

## 2015-09-16 DIAGNOSIS — N529 Male erectile dysfunction, unspecified: Secondary | ICD-10-CM | POA: Diagnosis not present

## 2015-09-16 MED ORDER — TADALAFIL 20 MG PO TABS: 20.0000 mg | ORAL_TABLET | Freq: Every day | ORAL | Status: DC | PRN

## 2015-09-16 NOTE — Progress Notes (Signed)
Chief Complaint: Patient presents for evaluation of a skin lesion.   Exam: Left upper AB with 8mm irreg border dark nevus   Anesthesia: Lidocaine 1% without epi    Procedure Details   The risks, benefits, indications, potential complications, and alternatives were explained to the patient and informed consent obtained.  ELECTRO: The lesion and surrounding area was given sterile prep using alcohol and draped in the usual sterile fashion. A 11 blade was used to excise an elliptical area of skin approximately 1cm by 1cm. The wound was closed with electrocaudry. Antibiotic ointment and a sterile dressing applied. The specimen was  sent for pathologic examination. The patient tolerated the procedure well with minimal blood loss.   Condition: Stable  Complications:  None  Diagnosis: Atypical nevus, unspecified D 22.9          ED          Grief reaction  Procedure code: [id number] one lesion          Plan: 1. Instructed to keep the wound dry and covered for 24-48 hours and clean thereafter. 2. Warning signs of infection were reviewed.    3. Recommended that the patient use OTC acetaminophen as needed for pain.  4. Suggest counseling  5. Cialis printed, suggest following with urology patient declines

## 2015-09-16 NOTE — Patient Instructions (Signed)
Mole Excision Moleexcision is a procedure to remove (excise) a mole from your skin. Most moles are noncancerous (benign) and they do not require treatment. But some moles are larger than usual or resemble cancerous moles (atypical moles).  You may have a mole excision if your health care provider wants to remove an atypical mole and test it in a laboratory to determine whether it is cancerous (biopsy). You may also have a mole excision if a mole causes pain or you would like it removed due to its appearance. LET YOUR HEALTH CARE PROVIDER KNOW ABOUT:  Any allergies you have.  All medicines you are taking, including vitamins, herbs, eye drops, creams, and over-the-counter medicines.  Previous problems you or members of your family have had with the use of anesthetics.  Previous problems you have had with pain medicines.  Any blood disorders you have.  Previous surgeries you have had.  Medical conditions you have. RISKS AND COMPLICATIONS Generally, this is a safe procedure. However, problems may occur, including:  Excessive bleeding.  Infection.  Scarring. BEFORE THE PROCEDURE  Ask your health care provider about:  Changing or stopping your regular medicines. This is especially important if you are taking diabetes medicines or blood thinners.  Taking medicines such as aspirin and ibuprofen. These medicines can thin your blood. Do not take these medicines before your procedure if your health care provider instructs you not to.  Follow your health care provider's instructions about eating or drinking restrictions.  Ask your health care provider if you should plan to have someone take you home after the procedure. PROCEDURE  An IV tube will be inserted in a vein.  You will be given one of the following:  A medicine that numbs the area (local anesthetic).  A medicine that makes you fall asleep (general anesthetic).  Your health care provider will outline the mole with ink and  mark the center with a dot. This serves as a guide during the procedure.  Depending on the size of your mole, your health care provider will remove it using:  A surgical blade. This is used to remove small moles.  A hollow tube with a sharp end (punch device). This may be used for larger moles.  Your health care provider will use stitches (sutures) to close the wound in the skin where the mole was removed (excision site). The procedure may vary among health care providers and hospitals. AFTER THE PROCEDURE  Your blood pressure, heart rate, breathing rate, and blood oxygen level may be monitored often until the medicines you were given have worn off.   This information is not intended to replace advice given to you by your health care provider. Make sure you discuss any questions you have with your health care provider.   Document Released: 08/18/2000 Document Revised: 09/11/2014 Document Reviewed: 04/22/2014 Elsevier Interactive Patient Education 2016 Elsevier Inc.   

## 2015-12-28 ENCOUNTER — Ambulatory Visit (INDEPENDENT_AMBULATORY_CARE_PROVIDER_SITE_OTHER): Payer: BLUE CROSS/BLUE SHIELD | Admitting: Physician Assistant

## 2015-12-28 ENCOUNTER — Encounter: Payer: Self-pay | Admitting: Physician Assistant

## 2015-12-28 VITALS — BP 110/80 | HR 92 | Temp 97.9°F | Resp 16 | Ht 72.0 in | Wt 187.2 lb

## 2015-12-28 DIAGNOSIS — R21 Rash and other nonspecific skin eruption: Secondary | ICD-10-CM

## 2015-12-28 MED ORDER — KETOCONAZOLE 2 % EX CREA: 1.0000 "application " | TOPICAL_CREAM | Freq: Every day | CUTANEOUS | Status: DC

## 2015-12-28 MED ORDER — PREDNISONE 20 MG PO TABS: ORAL_TABLET | ORAL | Status: DC

## 2015-12-28 NOTE — Patient Instructions (Signed)
Please take the prednisone to help decrease inflammation and therefore decrease symptoms. Take it it with food to avoid GI upset. It can cause increased energy but on the other hand it can make it hard to sleep at night so please take it AT [place], it takes 8-12 hours to start working so it will NOT affect your sleeping if you take it at night with your food!!  If you are diabetic it will increase your sugars so decrease carbs and monitor your sugars closely.    Seborrheic Dermatitis Seborrheic dermatitis involves pink or red skin with greasy, flaky scales. It usually occurs on the scalp, and it is often called dandruff. This condition may also affect the eyebrows, nose, ears, chest, and the bearded area of men's faces. It often occurs where skin has more oil (sebaceous) glands. It may come and go for no known reason, and it is often long-lasting (chronic). CAUSES The cause is not known. RISK FACTORS This condition is more like to develop in:  People who are stressed or tired.  People who have skin conditions, such as acne.  People who have certain conditions, such as:  HIV (human immunodeficiency virus).  AIDS (acquired immunodeficiency syndrome).  Parkinson disease.  An eating disorder.  Stroke.  Depression.  Epilepsy.  Alcoholism.  People who live in places that have extreme weather.  People who have a family history of seborrheic dermatitis.  People who use skin creams that are made with alcohol.  People who are 28-86 years old.  People who take certain medicines. SYMPTOMS Symptoms of this condition include:  Thick scales on the scalp.  Redness on the face or in the armpits.  Skin that is flaky. The flakes may be white or yellow.  Skin that seems oily or dry but is not helped with moisturizers.  Itching or burning in the affected areas. DIAGNOSIS This condition is diagnosed with a medical history and physical exam. A sample of your skin may be  tested (skin biopsy). You may need to see a skin specialist (dermatologist). TREATMENT There is no cure for this condition, but treatment can help to manage the symptoms. Treatment may include:  Cortisone (steroid) ointments, creams, and lotions.  Over-the-counter or prescription shampoos. HOME CARE INSTRUCTIONS  Apply over-the-counter and prescription medicines only as told by your health care provider.  Keep all follow-up visits as told by your health care provider. This is important.  Try to reduce your stress, such as with yoga or mediation. If you need help to reduce stress, ask your health care provider.  Shower or bathe as told by your health care provider.  Use any medicated shampoos as told by your health care provider. SEEK MEDICAL CARE IF:  Your symptoms do not improve with treatment.  Your symptoms get worse.  You have new symptoms.   This information is not intended to replace advice given to you by your health care provider. Make sure you discuss any questions you have with your health care provider.   Document Released: 08/21/2005 Document Revised: 05/12/2015 Document Reviewed: 01/06/2015 Elsevier Interactive Patient Education [employer].

## 2015-12-28 NOTE — Progress Notes (Signed)
   Subjective:    Patient ID: Willie Romero, male    DOB: 12-Nov-1975, 40 y.o.   MRN: [id number]  HPI 40 y.o. WM with periorbital rash and with tick bite.  Found tick on tip of penis less than 24 hours on him, denies fever, chills, joint pain, no other rashes, AB pain.  Has has  periorbital rash x Saturday, was using paint thinner and started to get rash during/after, has used clobetasol and benadryl without help. Has had a similar rash in the past. Some sinus allergies.  Moved into a condo near [place], built in 1920's.   Blood pressure 110/80, pulse 92, temperature 97.9 F (36.6 C), temperature source Temporal, resp. rate 16, height 6' (1.829 m), weight 187 lb 3.2 oz (84.913 kg), SpO2 98 %.  Review of Systems  Constitutional: Negative.   HENT: Negative.   Respiratory: Negative.   Cardiovascular: Negative.   Gastrointestinal: Negative.   Genitourinary: Negative.   Musculoskeletal: Negative.   Skin: Positive for rash. Negative for color change, pallor and wound.  Neurological: Negative.   Hematological: Negative.   Psychiatric/Behavioral: Negative.        Objective:   Physical Exam  Constitutional: He is oriented to person, place, and time. He appears well-developed and well-nourished.  Neck: Neck supple.  Cardiovascular: Normal rate and regular rhythm.   Pulmonary/Chest: Effort normal and breath sounds normal.  Abdominal: Soft. Bowel sounds are normal.  Lymphadenopathy:    He has no cervical adenopathy.  Neurological: He is alert and oriented to person, place, and time.  Skin: Skin is warm and dry. Rash noted.  Bilateral periorbital erythematous scaly rash, with bilateral scaly rash postauricular.        Assessment & Plan:  1. Rash and nonspecific skin eruption Eczema versus seb dermatitis versus allergic dermatitis Add zyrtec.  - ketoconazole (NIZORAL) 2 % cream; Apply 1 application topically daily.  Dispense: 30 g; Refill: 1 - predniSONE (DELTASONE) 20 MG  tablet; 2 tablets daily for 3 days, 1 tablet daily for 4 days.  Dispense: 10 tablet; Refill: 0

## 2016-02-16 ENCOUNTER — Other Ambulatory Visit: Payer: Self-pay | Admitting: Physician Assistant

## 2016-02-16 DIAGNOSIS — D229 Melanocytic nevi, unspecified: Secondary | ICD-10-CM

## 2016-02-16 DIAGNOSIS — R21 Rash and other nonspecific skin eruption: Secondary | ICD-10-CM

## 2016-02-16 NOTE — Progress Notes (Signed)
Patient complaining of continuing rash on his face as well as new rash under his arms, requesting derm referral.

## 2016-02-24 DIAGNOSIS — L259 Unspecified contact dermatitis, unspecified cause: Secondary | ICD-10-CM | POA: Diagnosis not present

## 2016-05-26 DIAGNOSIS — M5137 Other intervertebral disc degeneration, lumbosacral region: Secondary | ICD-10-CM | POA: Diagnosis not present

## 2016-05-26 DIAGNOSIS — M9905 Segmental and somatic dysfunction of pelvic region: Secondary | ICD-10-CM | POA: Diagnosis not present

## 2016-05-26 DIAGNOSIS — M5386 Other specified dorsopathies, lumbar region: Secondary | ICD-10-CM | POA: Diagnosis not present

## 2016-05-26 DIAGNOSIS — M9903 Segmental and somatic dysfunction of lumbar region: Secondary | ICD-10-CM | POA: Diagnosis not present

## 2016-06-05 DIAGNOSIS — M5137 Other intervertebral disc degeneration, lumbosacral region: Secondary | ICD-10-CM | POA: Diagnosis not present

## 2016-06-05 DIAGNOSIS — M9903 Segmental and somatic dysfunction of lumbar region: Secondary | ICD-10-CM | POA: Diagnosis not present

## 2016-06-05 DIAGNOSIS — M9905 Segmental and somatic dysfunction of pelvic region: Secondary | ICD-10-CM | POA: Diagnosis not present

## 2016-06-05 DIAGNOSIS — M5386 Other specified dorsopathies, lumbar region: Secondary | ICD-10-CM | POA: Diagnosis not present

## 2016-07-11 ENCOUNTER — Other Ambulatory Visit: Payer: Self-pay | Admitting: Physician Assistant

## 2016-07-11 ENCOUNTER — Telehealth: Payer: Self-pay

## 2016-07-11 DIAGNOSIS — R21 Rash and other nonspecific skin eruption: Secondary | ICD-10-CM

## 2016-07-11 MED ORDER — SCOPOLAMINE 1 MG/3DAYS TD PT72
1.0000 | MEDICATED_PATCH | TRANSDERMAL | 0 refills | Status: DC
Start: 2016-07-11 — End: 2016-08-14

## 2016-07-11 MED ORDER — PREDNISONE 20 MG PO TABS: ORAL_TABLET | ORAL | 0 refills | Status: DC

## 2016-07-11 NOTE — Telephone Encounter (Signed)
Informed pt of Rx for motion sickness that was sent to his pharmacy & that we would see him next mth for the rash.  Pt agreed.

## 2016-08-14 ENCOUNTER — Other Ambulatory Visit: Payer: Self-pay | Admitting: Physician Assistant

## 2016-08-14 ENCOUNTER — Ambulatory Visit (INDEPENDENT_AMBULATORY_CARE_PROVIDER_SITE_OTHER): Payer: BLUE CROSS/BLUE SHIELD | Admitting: Physician Assistant

## 2016-08-14 ENCOUNTER — Encounter: Payer: Self-pay | Admitting: Physician Assistant

## 2016-08-14 VITALS — BP 116/70 | HR 73 | Temp 97.3°F | Resp 16 | Ht 72.5 in | Wt 210.0 lb

## 2016-08-14 DIAGNOSIS — F411 Generalized anxiety disorder: Secondary | ICD-10-CM | POA: Diagnosis not present

## 2016-08-14 DIAGNOSIS — Z0001 Encounter for general adult medical examination with abnormal findings: Secondary | ICD-10-CM

## 2016-08-14 DIAGNOSIS — Z79899 Other long term (current) drug therapy: Secondary | ICD-10-CM | POA: Diagnosis not present

## 2016-08-14 DIAGNOSIS — E559 Vitamin D deficiency, unspecified: Secondary | ICD-10-CM | POA: Diagnosis not present

## 2016-08-14 DIAGNOSIS — N401 Enlarged prostate with lower urinary tract symptoms: Secondary | ICD-10-CM | POA: Diagnosis not present

## 2016-08-14 DIAGNOSIS — R6889 Other general symptoms and signs: Secondary | ICD-10-CM | POA: Diagnosis not present

## 2016-08-14 DIAGNOSIS — R21 Rash and other nonspecific skin eruption: Secondary | ICD-10-CM

## 2016-08-14 DIAGNOSIS — N529 Male erectile dysfunction, unspecified: Secondary | ICD-10-CM

## 2016-08-14 DIAGNOSIS — N138 Other obstructive and reflux uropathy: Secondary | ICD-10-CM

## 2016-08-14 DIAGNOSIS — Z1389 Encounter for screening for other disorder: Secondary | ICD-10-CM

## 2016-08-14 DIAGNOSIS — Z13220 Encounter for screening for lipoid disorders: Secondary | ICD-10-CM

## 2016-08-14 DIAGNOSIS — Z0000 Encounter for general adult medical examination without abnormal findings: Secondary | ICD-10-CM

## 2016-08-14 MED ORDER — CLOBETASOL PROPIONATE 0.05 % EX GEL: CUTANEOUS | 3 refills | Status: DC

## 2016-08-14 MED ORDER — FINASTERIDE 5 MG PO TABS: 5.0000 mg | ORAL_TABLET | Freq: Every day | ORAL | 1 refills | Status: DC

## 2016-08-14 MED ORDER — TADALAFIL 20 MG PO TABS: 20.0000 mg | ORAL_TABLET | Freq: Every day | ORAL | 3 refills | Status: DC | PRN

## 2016-08-14 MED ORDER — ERYTHROMYCIN 2 % EX GEL: Freq: Every day | CUTANEOUS | 0 refills | Status: DC

## 2016-08-14 NOTE — Progress Notes (Signed)
Complete Physical  Assessment and Plan:   Generalized anxiety disorder Declines meds at this time  stress management techniques discussed, increase water, good sleep hygiene discussed, increase exercise, and increase veggies.  - TSH  Axillary rash-  Inverse psoriasis plaques versus dermatitis Clobetasol/erythromycin gel, use gel deodorant, if not better derm  Screening for blood or protein in urine - Urinalysis, Routine w reflex microscopic (not at [facility]) - Microalbumin / creatinine urine ratio   Vitamin D deficiency - VITAMIN D 25 Hydroxy (Vit-D Deficiency, Fractures)  Medication management - CBC with Differential/Platelet - BASIC METABOLIC PANEL WITH GFR - Hepatic function panel - Magnesium  Screening cholesterol level - Lipid panel   BPH with obstruction/lower urinary tract symptoms - PSA   Discussed med's effects and SE's. Screening labs and tests as requested with regular follow-up as recommended. Over 40 minutes of exam, counseling, chart review and critical decision making was performed   HPI  This very nice 40 y.o.male presents for complete physical.  Patient has no major health issues.  Patient reports no complaints at this time.  Patient moved here recently, started as new patient, wife was in hospice at the time but has recently passed from ovarian cancer.  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  Has some dryness around eyes and hands, happens during winter.    Current Medications:  Current Outpatient Prescriptions on File Prior to Visit  Medication Sig Dispense Refill  . ALPRAZolam (XANAX) 1 MG tablet 1/2-1 tablet as needed twice daily for anxiety or sleep. 60 tablet 1  . finasteride (PROSCAR) 5 MG tablet Take 1 tablet (5 mg total) by mouth daily. 30 tablet 6  . ketoconazole (NIZORAL) 2 % cream Apply 1 application topically daily. 30 g 1  . predniSONE (DELTASONE) 20 MG tablet  2 tablets daily for 3 days, 1 tablet daily for 4 days. 10 tablet 0  . scopolamine (TRANSDERM-SCOP, 1.5 MG,) 1 MG/3DAYS Place 1 patch (1.5 mg total) onto the skin every 3 (three) days. 4 patch 0  . tadalafil (CIALIS) 20 MG tablet Take 1 tablet (20 mg total) by mouth daily as needed for erectile dysfunction. 30 tablet 3   No current facility-administered medications on file prior to visit.    Health Maintenance:    TD/TDAP: declines Influenza: declines Pneumovax: N/A Prevnar 13: N/A  Sexually Active: yes  Medical History:  Past Medical History:  Diagnosis Date  . Anxiety   . Stricture, urethra    Dr. Risa Grill 2005   Allergies No Known Allergies  SURGICAL HISTORY He  has no past surgical history on file. FAMILY HISTORY His family history includes Cancer (age of onset: 52) in his mother; Cancer (age of onset: 58) in his father. SOCIAL HISTORY He  reports that he has never smoked. He has never used smokeless tobacco. He reports that he drinks alcohol. He reports that he does not use drugs.   Review of Systems: Review of Systems  Constitutional: Negative for chills, diaphoresis, fever, malaise/fatigue and weight loss.  HENT: Negative.   Eyes: Negative.   Respiratory: Negative.   Cardiovascular: Negative.   Gastrointestinal: Negative.   Genitourinary: Positive for frequency and urgency. Negative for dysuria, flank pain and hematuria.  Musculoskeletal: Negative for back pain, falls, joint pain, myalgias and neck pain.  Skin: Positive for rash.  Neurological: Negative.  Negative for weakness.  Endo/Heme/Allergies: Negative.   Psychiatric/Behavioral: Negative for depression, hallucinations, memory  loss, substance abuse and suicidal ideas. The patient is nervous/anxious. The patient does not have insomnia.     Physical Exam: Estimated body mass index is 25.39 kg/m as calculated from the following:   Height as of 12/28/15: 6' (1.829 m).   Weight as of 12/28/15: 187 lb 3.2 oz  (84.9 kg). There were no vitals taken for this visit. General Appearance: Well nourished, in no apparent distress.  Eyes: PERRLA, EOMs, conjunctiva no swelling or erythema, normal fundi and vessels.  Sinuses: No Frontal/maxillary tenderness  ENT/Mouth: Ext aud canals clear, normal light reflex with TMs without erythema, bulging. Good dentition. No erythema, swelling, or exudate on post pharynx. Tonsils not swollen or erythematous. Hearing normal.  Neck: Supple, thyroid normal. No bruits  Respiratory: Respiratory effort normal, BS equal bilaterally without rales, rhonchi, wheezing or stridor.  Cardio: RRR without murmurs, rubs or gallops. Brisk peripheral pulses without edema.  Chest: symmetric, with normal excursions and percussion.  Abdomen: Soft, nontender, no guarding, rebound, hernias, masses, or organomegaly.  Lymphatics: Non tender without lymphadenopathy.  Genitourinary: defer Musculoskeletal: Full ROM all peripheral extremities,5/5 strength, and normal gait.  Skin: Bilateral axilla with well circumscribed shiny plaques that are red/brown, 3x3 cm Warm, dry without rashes, lesions, ecchymosis. Neuro: Cranial nerves intact, reflexes equal bilaterally. Normal muscle tone, no cerebellar symptoms. Sensation intact.  Psych: Awake and oriented X 3, normal affect, Insight and Judgment appropriate.   EKG: defer  Vicie Mutters 10:05 AM [facility]

## 2016-08-15 DIAGNOSIS — N401 Enlarged prostate with lower urinary tract symptoms: Secondary | ICD-10-CM | POA: Diagnosis not present

## 2016-08-15 LAB — HEPATIC FUNCTION PANEL
ALBUMIN: 4.4 g/dL (ref 3.6–5.1)
ALK PHOS: 71 U/L (ref 40–115)
ALT: 30 U/L (ref 9–46)
AST: 28 U/L (ref 10–40)
BILIRUBIN TOTAL: 0.5 mg/dL (ref 0.2–1.2)
Bilirubin, Direct: 0.1 mg/dL (ref <=0.2)
Indirect Bilirubin: 0.4 mg/dL (ref 0.2–1.2)
TOTAL PROTEIN: 7 g/dL (ref 6.1–8.1)

## 2016-08-15 LAB — BASIC METABOLIC PANEL WITH GFR
BUN: 17 mg/dL (ref 7–25)
CO2: 26 mmol/L (ref 20–31)
CREATININE: 1.02 mg/dL (ref 0.60–1.35)
Calcium: 9.4 mg/dL (ref 8.6–10.3)
Chloride: 103 mmol/L (ref 98–110)
GFR, Est Non African American: >89 mL/min (ref >=60)
GLUCOSE: 79 mg/dL (ref 65–99)
Potassium: 4.4 mmol/L (ref 3.5–5.3)
Sodium: 140 mmol/L (ref 135–146)

## 2016-08-15 LAB — CBC WITH DIFFERENTIAL/PLATELET
BASOS ABS: 0 {cells}/uL (ref 0–200)
Basophils Relative: 0 %
EOS PCT: 2 %
Eosinophils Absolute: 162 cells/uL (ref 15–500)
HCT: 42.2 % (ref 38.5–50.0)
Hemoglobin: 14.5 g/dL (ref 13.2–17.1)
LYMPHS PCT: 28 %
Lymphs Abs: 2268 cells/uL (ref 850–3900)
MCH: 29.8 pg (ref 27.0–33.0)
MCHC: 34.4 g/dL (ref 32.0–36.0)
MCV: 86.7 fL (ref 80.0–100.0)
MONOS PCT: 9 %
MPV: 9.6 fL (ref 7.5–12.5)
Monocytes Absolute: 729 cells/uL (ref 200–950)
NEUTROS PCT: 61 %
Neutro Abs: 4941 cells/uL (ref 1500–7800)
PLATELETS: 261 10*3/uL (ref 140–400)
RBC: 4.87 MIL/uL (ref 4.20–5.80)
RDW: 13 % (ref 11.0–15.0)
WBC: 8.1 10*3/uL (ref 3.8–10.8)

## 2016-08-15 LAB — URINALYSIS, ROUTINE W REFLEX MICROSCOPIC
BILIRUBIN URINE: NEGATIVE
Glucose, UA: NEGATIVE
Hgb urine dipstick: NEGATIVE
Ketones, ur: NEGATIVE
NITRITE: NEGATIVE
PROTEIN: NEGATIVE
SPECIFIC GRAVITY, URINE: 1.015 (ref 1.001–1.035)
pH: 6.5 (ref 5.0–8.0)

## 2016-08-15 LAB — VITAMIN D 25 HYDROXY (VIT D DEFICIENCY, FRACTURES): Vit D, 25-Hydroxy: 36 ng/mL (ref 30–100)

## 2016-08-15 LAB — URINALYSIS, MICROSCOPIC ONLY
Casts: NONE SEEN [LPF]
Crystals: NONE SEEN [HPF]
RBC / HPF: NONE SEEN RBC/HPF (ref <=2)
Squamous Epithelial / LPF: NONE SEEN [HPF] (ref <=5)
Yeast: NONE SEEN [HPF]

## 2016-08-15 LAB — LIPID PANEL
Cholesterol: 153 mg/dL (ref <200)
HDL: 63 mg/dL (ref >40)
LDL CALC: 71 mg/dL (ref <100)
TRIGLYCERIDES: 96 mg/dL (ref <150)
Total CHOL/HDL Ratio: 2.4 Ratio (ref <5.0)
VLDL: 19 mg/dL (ref <30)

## 2016-08-15 LAB — MICROALBUMIN / CREATININE URINE RATIO
CREATININE, URINE: 112 mg/dL (ref 20–370)
MICROALB UR: 0.5 mg/dL
Microalb Creat Ratio: 4 mcg/mg creat (ref <30)

## 2016-08-15 LAB — PSA: PSA: 0.1 ng/mL (ref <=4.0)

## 2016-08-15 LAB — MAGNESIUM: MAGNESIUM: 1.9 mg/dL (ref 1.5–2.5)

## 2016-08-15 LAB — TSH: TSH: 1.42 m[IU]/L (ref 0.40–4.50)

## 2016-08-16 LAB — URINE CULTURE: Organism ID, Bacteria: NO GROWTH

## 2017-08-19 NOTE — Progress Notes (Signed)
Complete Physical  Assessment and Plan:   Generalized anxiety disorder Xanax 0,5 #30 PRN, if uses frequently will do counseling/daily med - discussed addictive nature  stress management techniques discussed, increase water, good sleep hygiene discussed, increase exercise, and increase veggies.  - TSH  Axillary rash-  Continue cream Clobetasol/erythromycin gel,  if not better derm  Screening for blood or protein in urine - Urinalysis, Routine w reflex microscopic (not at [facility]) - Microalbumin / creatinine urine ratio   Vitamin D deficiency - VITAMIN D 25 Hydroxy (Vit-D Deficiency, Fractures)  Medication management - CBC with Differential/Platelet - BASIC METABOLIC PANEL WITH GFR - Hepatic function panel - Magnesium  Screening cholesterol level - Lipid panel  Routine general medical examination at a health care facility  BPH with obstruction/lower urinary tract symptoms -     Urinalysis, Routine w reflex microscopic -     Microalbumin / creatinine urine ratio  BMI 28.0-28.9,adult  Overweight  - long discussion about weight loss, diet, and exercise -recommended diet heavy in fruits and veggies and low in animal meats, cheeses, and dairy products  Acute otitis media, unspecified otitis media type -     azithromycin (ZITHROMAX) 250 MG tablet; Take 2 tablets (500 mg) on  Day 1,  followed by 1 tablet (250 mg) once daily on Days 2 through 5.  Abnormal EKG -     DG Chest 2 View; Future   Discussed med's effects and SE's. Screening labs and tests as requested with regular follow-up as recommended. Over 40 minutes of exam, counseling, chart review and critical decision making was performed   HPI  This very nice 41 y.o.male presents for complete physical.  Patient has no major health issues.  Patient reports no complaints at this time.  Patient moved here recently, started as new patient, wife was in hospice at the time but has recently passed from ovarian cancer.  Recently  got off a cruise, has had cold x 7 days, sinus congestion with cough and mucus, ear fullness, using sudafed and afrin. No fever or chills. Staying the same.  He does workout but has not recently.  He continues to have decreased flow, sprays, weak stream, no blood in urine. No issues with ejaculation.  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  Lab Results  Component Value Date   CHOL 153 08/14/2016   HDL 63 08/14/2016   LDLCALC 71 08/14/2016   TRIG 96 08/14/2016   CHOLHDL 2.4 08/14/2016   BMI is Body mass index is 28.45 kg/m., he is working on diet and exercise. Wt Readings from Last 3 Encounters:  08/20/17 218 lb 9.6 oz (99.2 kg)  08/14/16 210 lb (95.3 kg)  12/28/15 187 lb 3.2 oz (84.9 kg)     Current Medications:  Current Outpatient Medications on File Prior to Visit  Medication Sig Dispense Refill  . clobetasol (TEMOVATE) 0.05 % GEL Apply twice daily for 10 days 30 each 3  . erythromycin with ethanol (EMGEL) 2 % gel Apply topically daily. 30 g 0  . finasteride (PROSCAR) 5 MG tablet Take 1 tablet (5 mg total) by mouth daily. 90 tablet 1  . tadalafil (CIALIS) 20 MG tablet Take 1 tablet (20 mg total) by mouth daily as needed for erectile dysfunction. 60 tablet 3   No current facility-administered medications on file prior to visit.    Health Maintenance:    TD/TDAP: declines Influenza: declines Pneumovax: N/A Prevnar 13: N/A  Sexually Active: yes married  Medical History:  Past Medical History:  Diagnosis Date  . Anxiety   . Stricture, urethra    Dr. Risa Grill 2005   Allergies No Known Allergies  SURGICAL HISTORY He  has no past surgical history on file. FAMILY HISTORY His family history includes Cancer (age of onset: 30) in his mother; Cancer (age of onset: 48) in his father. SOCIAL HISTORY He  reports that  has never smoked. he has never used smokeless tobacco. He reports that he drinks alcohol. He reports that he does not use drugs.   Review of Systems: Review  of Systems  Constitutional: Negative for chills, diaphoresis, fever, malaise/fatigue and weight loss.  HENT: Positive for congestion, ear pain and sinus pain.   Eyes: Negative.   Respiratory: Negative.   Cardiovascular: Negative.   Gastrointestinal: Negative.   Genitourinary: Positive for frequency and urgency. Negative for dysuria, flank pain and hematuria.  Musculoskeletal: Negative for back pain, falls, joint pain, myalgias and neck pain.  Skin: Positive for rash.  Neurological: Negative.  Negative for weakness.  Endo/Heme/Allergies: Negative.   Psychiatric/Behavioral: Negative for depression, hallucinations, memory loss, substance abuse and suicidal ideas. The patient is nervous/anxious. The patient does not have insomnia.     Physical Exam: Estimated body mass index is 28.09 kg/m as calculated from the following:   Height as of 08/14/16: 6' 0.5" (1.842 m).   Weight as of 08/14/16: 210 lb (95.3 kg). There were no vitals taken for this visit. General Appearance: Well nourished, in no apparent distress.  Eyes: PERRLA, EOMs, conjunctiva no swelling or erythema, normal fundi and vessels.  Sinuses: + Frontal/maxillary tenderness  ENT/Mouth: Ext aud canals clear, normal light reflex with TMs without erythema on the right, left ear with clear fluid line with erythematous cloudy fluid and bulging. Good dentition. No erythema, swelling, or exudate on post pharynx. Tonsils not swollen or erythematous. Hearing normal.  Neck: Supple, thyroid normal. No bruits  Respiratory: Respiratory effort normal, BS equal bilaterally without rales, rhonchi, wheezing or stridor.  Cardio: RRR without murmurs, rubs or gallops. Brisk peripheral pulses without edema.  Chest: symmetric, with normal excursions and percussion.  Abdomen: Soft, nontender, no guarding, rebound, hernias, masses, or organomegaly.  Lymphatics: Non tender without lymphadenopathy.  Genitourinary: defer Musculoskeletal: Full ROM all  peripheral extremities,5/5 strength, and normal gait.  Skin: Bilateral axilla with well circumscribed shiny plaques that are red/brown, 3x3 cm Warm, dry without rashes, lesions, ecchymosis.See picture for moles to monitor on left lower back Neuro: Cranial nerves intact, reflexes equal bilaterally. Normal muscle tone, no cerebellar symptoms. Sensation intact.  Psych: Awake and oriented X 3, normal affect, Insight and Judgment appropriate.    EKG: questionable LVH versus body habitus, get CXR     Vicie Mutters 5:18 PM [facility]

## 2017-08-20 ENCOUNTER — Encounter: Payer: Self-pay | Admitting: Physician Assistant

## 2017-08-20 ENCOUNTER — Other Ambulatory Visit: Payer: Self-pay

## 2017-08-20 ENCOUNTER — Ambulatory Visit (INDEPENDENT_AMBULATORY_CARE_PROVIDER_SITE_OTHER): Payer: BLUE CROSS/BLUE SHIELD | Admitting: Physician Assistant

## 2017-08-20 VITALS — BP 132/80 | HR 88 | Temp 97.5°F | Resp 18 | Ht 73.5 in | Wt 218.6 lb

## 2017-08-20 DIAGNOSIS — E559 Vitamin D deficiency, unspecified: Secondary | ICD-10-CM | POA: Diagnosis not present

## 2017-08-20 DIAGNOSIS — Z0000 Encounter for general adult medical examination without abnormal findings: Secondary | ICD-10-CM

## 2017-08-20 DIAGNOSIS — F411 Generalized anxiety disorder: Secondary | ICD-10-CM

## 2017-08-20 DIAGNOSIS — Z79899 Other long term (current) drug therapy: Secondary | ICD-10-CM

## 2017-08-20 DIAGNOSIS — Z1389 Encounter for screening for other disorder: Secondary | ICD-10-CM

## 2017-08-20 DIAGNOSIS — Z13220 Encounter for screening for lipoid disorders: Secondary | ICD-10-CM

## 2017-08-20 DIAGNOSIS — N401 Enlarged prostate with lower urinary tract symptoms: Secondary | ICD-10-CM

## 2017-08-20 DIAGNOSIS — R9431 Abnormal electrocardiogram [ECG] [EKG]: Secondary | ICD-10-CM

## 2017-08-20 DIAGNOSIS — H6690 Otitis media, unspecified, unspecified ear: Secondary | ICD-10-CM

## 2017-08-20 DIAGNOSIS — N138 Other obstructive and reflux uropathy: Secondary | ICD-10-CM

## 2017-08-20 DIAGNOSIS — Z6828 Body mass index (BMI) 28.0-28.9, adult: Secondary | ICD-10-CM

## 2017-08-20 DIAGNOSIS — R21 Rash and other nonspecific skin eruption: Secondary | ICD-10-CM

## 2017-08-20 MED ORDER — TADALAFIL 20 MG PO TABS: 20.0000 mg | ORAL_TABLET | Freq: Every day | ORAL | 3 refills | Status: DC | PRN

## 2017-08-20 MED ORDER — AZITHROMYCIN 250 MG PO TABS: ORAL_TABLET | ORAL | 1 refills | Status: AC

## 2017-08-20 MED ORDER — FINASTERIDE 5 MG PO TABS: 5.0000 mg | ORAL_TABLET | Freq: Every day | ORAL | 1 refills | Status: DC

## 2017-08-20 MED ORDER — ALPRAZOLAM 0.5 MG PO TABS: 0.5000 mg | ORAL_TABLET | Freq: Two times a day (BID) | ORAL | 0 refills | Status: AC | PRN

## 2017-08-20 NOTE — Patient Instructions (Signed)
Take the antibiotic and do this too  - Try the Flonase or Nasonex. Remember to spray each nostril twice towards the outer part of your eye.  Do not sniff but instead pinch your nose and tilt your head back to help the medicine get into your sinuses.  The best time to do this is at bedtime.Stop if you get blurred vision or nose bleeds.   -While drinking fluids, pinch and hold nose close and swallow, to help open eustachian tubes to drain fluid behind ear drums.  -Please pick one of the over the counter allergy medications below and take it once daily for allergies.  It will also help with fluid behind ear drums. Claritin or loratadine cheapest but likely the weakest  Zyrtec or certizine at night because it can make you sleepy The strongest is allegra or fexafinadine   Cheapest at [facility], [employer], [employer]  -can use decongestant over the counter, please do not use if you have high blood pressure or certain heart conditions.   if worsening HA, changes vision/speech, imbalance, weakness go to the ER   Heat Rash, Adult Heat rash is an itchy rash of little red bumps that often occurs during hot, humid weather. Heat rash is also called prickly heat or miliaria. Heat rash usually affects:  Armpits.  Elbows.  Groin.  Neck.  The area underneath the breasts.  Shoulders.  Chest.  What are the causes? This condition is caused by blocked sweat ducts. When sweat is trapped under the skin, it spreads into surrounding tissues and causes a rash of red bumps. What increases the risk? This condition is more likely to develop in people who:  Are overdressed in hot, humid weather.  Wear clothing that rubs against the skin.  Are active in hot, humid weather.  Sweat a lot.  Are not used to hot, humid weather.  What are the signs or symptoms? Symptoms of this condition include:  Small red bumps that are itchy or prickly.  Very little sweating or no sweating in the affected area.  How is  this diagnosed? This condition is diagnosed based on your symptoms and medical history, as well as a physical exam. How is this treated? Moving to a cool, dry place is the best treatment for heat rash. Treatment may also include medicines, such as:  Corticosteroid creams for skin irritation.  Antibiotic medicines, if the rash becomes infected.  Follow these instructions at home: Skin care  Keep the affected area dry.  Do not apply ointments or creams that contain mineral oil or petroleum ingredients to your skin. These can make the condition worse.  Apply cool compresses to the affected areas.  Do not scratch your skin.  Do not take hot showers or baths. General instructions  Take over-the-counter and prescription medicines only as told by your health care provider.  If you were prescribed an antibiotic, take it as told by your health care provider. Do not stop taking it even if your condition improves.  Stay in a cool room as much as possible. Use an air conditioner or fan, if possible.  Do not wear tight clothes. Wear comfortable, loose-fitting clothing.  Keep all follow-up visits as told by your health care provider. This is important. Contact a health care provider if:  You have a fever.  Your rash does not go away after 3-4 days.  Your rash gets worse or it is very itchy.  Your rash has pus or fluid coming from it. Get help right  away if:  You are dizzy or nauseated.  You feel confused.  You have trouble breathing.  You have chest pain.  You have muscle cramps or contractions.  You faint. Summary  Heat rash is an itchy rash of little red bumps that often occurs during hot, humid weather.  Symptoms of heat rash include small red bumps that are itchy or prickly and very little or no sweating in the affected area.  This condition is diagnosed based on your symptoms and medical history, as well as a physical exam.  Moving to a cool, dry place is the best  treatment for heat rash.  Do not wear tight clothes. Wear comfortable, loose-fitting clothing. This information is not intended to replace advice given to you by your health care provider. Make sure you discuss any questions you have with your health care provider. Document Released: 08/09/2009 Document Revised: 11/01/2016 Document Reviewed: 11/01/2016 Elsevier Interactive Patient Education  [employer].

## 2017-08-21 ENCOUNTER — Ambulatory Visit (HOSPITAL_COMMUNITY)
Admission: RE | Admit: 2017-08-21 | Discharge: 2017-08-21 | Disposition: A | Discharge status: Home / Self Care | Payer: BLUE CROSS/BLUE SHIELD | Source: Ambulatory Visit | Attending: Physician Assistant | Admitting: Physician Assistant

## 2017-08-21 ENCOUNTER — Encounter: Payer: Self-pay | Admitting: Physician Assistant

## 2017-08-21 DIAGNOSIS — R9431 Abnormal electrocardiogram [ECG] [EKG]: Secondary | ICD-10-CM | POA: Diagnosis not present

## 2017-08-21 DIAGNOSIS — I10 Essential (primary) hypertension: Secondary | ICD-10-CM | POA: Insufficient documentation

## 2017-08-21 LAB — URINALYSIS, ROUTINE W REFLEX MICROSCOPIC
BACTERIA UA: NONE SEEN /HPF
Bilirubin Urine: NEGATIVE
GLUCOSE, UA: NEGATIVE
HGB URINE DIPSTICK: NEGATIVE
HYALINE CAST: NONE SEEN /LPF
Ketones, ur: NEGATIVE
NITRITE: NEGATIVE
PH: 6 (ref 5.0–8.0)
Protein, ur: NEGATIVE
RBC / HPF: NONE SEEN /HPF (ref 0–2)
SPECIFIC GRAVITY, URINE: 1.013 (ref 1.001–1.03)
SQUAMOUS EPITHELIAL / LPF: NONE SEEN /HPF (ref <=5)
WBC, UA: >=60 /HPF — AB (ref 0–5)

## 2017-08-21 LAB — CBC WITH DIFFERENTIAL/PLATELET
BASOS ABS: 74 {cells}/uL (ref 0–200)
BASOS PCT: 0.7 %
EOS ABS: 95 {cells}/uL (ref 15–500)
Eosinophils Relative: 0.9 %
HCT: 46.7 % (ref 38.5–50.0)
Hemoglobin: 15.7 g/dL (ref 13.2–17.1)
Lymphs Abs: 2321 cells/uL (ref 850–3900)
MCH: 28.2 pg (ref 27.0–33.0)
MCHC: 33.6 g/dL (ref 32.0–36.0)
MCV: 84 fL (ref 80.0–100.0)
MPV: 10.1 fL (ref 7.5–12.5)
Monocytes Relative: 6.8 %
NEUTROS PCT: 69.5 %
Neutro Abs: 7298 cells/uL (ref 1500–7800)
PLATELETS: 303 10*3/uL (ref 140–400)
RBC: 5.56 10*6/uL (ref 4.20–5.80)
RDW: 11.9 % (ref 11.0–15.0)
TOTAL LYMPHOCYTE: 22.1 %
WBC: 10.5 10*3/uL (ref 3.8–10.8)
WBCMIX: 714 {cells}/uL (ref 200–950)

## 2017-08-21 LAB — HEPATIC FUNCTION PANEL
AG Ratio: 1.9 (calc) (ref 1.0–2.5)
ALKALINE PHOSPHATASE (APISO): 75 U/L (ref 40–115)
ALT: 19 U/L (ref 9–46)
AST: 21 U/L (ref 10–40)
Albumin: 4.7 g/dL (ref 3.6–5.1)
BILIRUBIN DIRECT: 0.1 mg/dL (ref 0.0–0.2)
BILIRUBIN INDIRECT: 0.2 mg/dL (ref 0.2–1.2)
BILIRUBIN TOTAL: 0.3 mg/dL (ref 0.2–1.2)
Globulin: 2.5 g/dL (calc) (ref 1.9–3.7)
Total Protein: 7.2 g/dL (ref 6.1–8.1)

## 2017-08-21 LAB — MICROALBUMIN / CREATININE URINE RATIO
Creatinine, Urine: 76 mg/dL (ref 20–320)
MICROALB UR: 0.8 mg/dL
MICROALB/CREAT RATIO: 11 ug/mg{creat} (ref <30)

## 2017-08-21 LAB — LIPID PANEL
Cholesterol: 165 mg/dL (ref <200)
HDL: 56 mg/dL (ref >40)
LDL Cholesterol (Calc): 85 mg/dL (calc)
NON-HDL CHOLESTEROL (CALC): 109 mg/dL (ref <130)
Total CHOL/HDL Ratio: 2.9 (calc) (ref <5.0)
Triglycerides: 139 mg/dL (ref <150)

## 2017-08-21 LAB — BASIC METABOLIC PANEL WITH GFR
BUN: 20 mg/dL (ref 7–25)
CALCIUM: 9.6 mg/dL (ref 8.6–10.3)
CHLORIDE: 103 mmol/L (ref 98–110)
CO2: 31 mmol/L (ref 20–32)
Creat: 1.3 mg/dL (ref 0.60–1.35)
GFR, EST AFRICAN AMERICAN: 78 mL/min/{1.73_m2} (ref >=60)
GFR, EST NON AFRICAN AMERICAN: 67 mL/min/{1.73_m2} (ref >=60)
Glucose, Bld: 81 mg/dL (ref 65–99)
Potassium: 4.6 mmol/L (ref 3.5–5.3)
SODIUM: 140 mmol/L (ref 135–146)

## 2017-08-21 LAB — VITAMIN D 25 HYDROXY (VIT D DEFICIENCY, FRACTURES): VIT D 25 HYDROXY: 37 ng/mL (ref 30–100)

## 2017-08-21 LAB — MAGNESIUM: MAGNESIUM: 2.1 mg/dL (ref 1.5–2.5)

## 2017-08-21 LAB — TSH: TSH: 1.56 m[IU]/L (ref 0.40–4.50)

## 2017-08-22 NOTE — Progress Notes (Signed)
Pt aware of lab results & voiced understanding of those results.

## 2017-09-05 ENCOUNTER — Encounter: Payer: Self-pay | Admitting: Physician Assistant

## 2017-09-05 NOTE — Addendum Note (Signed)
Addended by: Vicie Mutters R on: 09/05/2017 09:19 AM   Modules accepted: Orders

## 2018-08-24 DIAGNOSIS — H61899 Other specified disorders of external ear, unspecified ear: Secondary | ICD-10-CM | POA: Diagnosis not present

## 2018-09-03 ENCOUNTER — Encounter: Payer: Self-pay | Admitting: Physician Assistant

## 2018-10-21 NOTE — Progress Notes (Signed)
Complete Physical  Assessment and Plan:  Routine general medical examination at a health care facility  BPH with obstruction/lower urinary tract symptoms -     PSA  Screening cholesterol level -     Lipid panel  Screening for blood or protein in urine -     Urinalysis, Routine w reflex microscopic -     Microalbumin / creatinine urine ratio  Medication management -     CBC with Differential/Platelet -     COMPLETE METABOLIC PANEL WITH GFR -     Magnesium  Vitamin D deficiency -     VITAMIN D 25 Hydroxy (Vit-D Deficiency, Fractures)  Generalized anxiety disorder -     TSH  BMI 28.0-28.9,adult  Screening for cardiovascular condition -     EKG 12-Lead  Screening, anemia, deficiency, iron -     Iron,Total/Total Iron Binding Cap -     Vitamin B12  Screening for diabetes mellitus -     Hemoglobin A1c  Other orders -     nystatin (MYCOSTATIN/NYSTOP) powder; Apply daily after a shower as needed -     sertraline (ZOLOFT) 50 MG tablet; Take 1 tablet (50 mg total) by mouth daily.    Discussed med's effects and SE's. Screening labs and tests as requested with regular follow-up as recommended. Over 40 minutes of exam, counseling, chart review and critical decision making was performed   HPI  This very nice 43 y.o.male presents for complete physical.  Patient has no major health issues.   He states he has had OCD when he leaves for vacation, has excessive worry.   Patient moved here recently, started as new patient, wife was in hospice at the time but has recently passed from ovarian cancer.    He does workout but cardio x 1 hour.  He continues to have decreased flow, sprays, weak stream, had clots/blood in urine couple months ago, states had been dealing with this for years, had surgery 12 years ago.  No issues with ejaculation.  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  Lab Results  Component Value Date   CHOL 165 08/20/2017   HDL 56 08/20/2017   LDLCALC 85  08/20/2017   TRIG 139 08/20/2017   CHOLHDL 2.9 08/20/2017   BMI is Body mass index is 29.19 kg/m., he is working on diet and exercise. Wt Readings from Last 3 Encounters:  10/23/18 215 lb 3.2 oz (97.6 kg)  08/20/17 218 lb 9.6 oz (99.2 kg)  08/14/16 210 lb (95.3 kg)     Current Medications:  Current Outpatient Medications on File Prior to Visit  Medication Sig Dispense Refill  . clobetasol (TEMOVATE) 0.05 % GEL Apply twice daily for 10 days 30 each 3  . erythromycin with ethanol (EMGEL) 2 % gel Apply topically daily. 30 g 0  . finasteride (PROSCAR) 5 MG tablet Take 1 tablet (5 mg total) by mouth daily. 90 tablet 1  . tadalafil (CIALIS) 20 MG tablet Take 1 tablet (20 mg total) by mouth daily as needed for erectile dysfunction. 60 tablet 3   No current facility-administered medications on file prior to visit.    Health Maintenance:    TD/TDAP: declines Influenza: declines Pneumovax: N/A Prevnar 13: N/A  Sexually Active: yes married  Medical History:  Past Medical History:  Diagnosis Date  . Anxiety   . Stricture, urethra    Dr. Risa Grill 2005   Allergies No Known Allergies  SURGICAL HISTORY He  has no past surgical history on  file. FAMILY HISTORY His family history includes Cancer (age of onset: 51) in his mother; Cancer (age of onset: 19) in his father; Proteinuria in his father. SOCIAL HISTORY He  reports that he has never smoked. He has never used smokeless tobacco. He reports current alcohol use. He reports that he does not use drugs.   Review of Systems: Review of Systems  Constitutional: Negative for chills, diaphoresis, fever, malaise/fatigue and weight loss.  HENT: Positive for congestion, ear pain and sinus pain.   Eyes: Negative.   Respiratory: Negative.   Cardiovascular: Negative.   Gastrointestinal: Negative.   Genitourinary: Positive for frequency and urgency. Negative for dysuria, flank pain and hematuria.  Musculoskeletal: Negative for back pain,  falls, joint pain, myalgias and neck pain.  Skin: Positive for rash.  Neurological: Negative.  Negative for weakness.  Endo/Heme/Allergies: Negative.   Psychiatric/Behavioral: Negative for depression, hallucinations, memory loss, substance abuse and suicidal ideas. The patient is nervous/anxious. The patient does not have insomnia.     Physical Exam: Estimated body mass index is 29.19 kg/m as calculated from the following:   Height as of this encounter: 6' (1.829 m).   Weight as of this encounter: 215 lb 3.2 oz (97.6 kg). BP 110/80   Pulse 75   Temp 98.3 F (36.8 C)   Ht 6' (1.829 m)   Wt 215 lb 3.2 oz (97.6 kg)   SpO2 98%   BMI 29.19 kg/m  General Appearance: Well nourished, in no apparent distress.  Eyes: PERRLA, EOMs, conjunctiva no swelling or erythema, normal fundi and vessels.  Sinuses: + Frontal/maxillary tenderness  ENT/Mouth: Ext aud canals clear, normal light reflex with TMs without erythema on the right, left ear with clear fluid line with erythematous cloudy fluid and bulging. Good dentition. No erythema, swelling, or exudate on post pharynx. Tonsils not swollen or erythematous. Hearing normal.  Neck: Supple, thyroid normal. No bruits  Respiratory: Respiratory effort normal, BS equal bilaterally without rales, rhonchi, wheezing or stridor.  Cardio: RRR without murmurs, rubs or gallops. Brisk peripheral pulses without edema.  Chest: symmetric, with normal excursions and percussion.  Abdomen: Soft, nontender, no guarding, rebound, hernias, masses, or organomegaly.  Lymphatics: Non tender without lymphadenopathy.  Genitourinary: defer Musculoskeletal: Full ROM all peripheral extremities,5/5 strength, and normal gait.  Skin: Bilateral axilla with well circumscribed shiny plaques that are red/brown, 3x3 cm Warm, dry without rashes, lesions, ecchymosis.See picture for moles to monitor on left lower back Neuro: Cranial nerves intact, reflexes equal bilaterally. Normal muscle  tone, no cerebellar symptoms. Sensation intact.  Psych: Awake and oriented X 3, normal affect, Insight and Judgment appropriate.    EKG: questionable LVH versus body habitus, get CXR     Vicie Mutters 10:30 AM [facility]

## 2018-10-23 ENCOUNTER — Ambulatory Visit (INDEPENDENT_AMBULATORY_CARE_PROVIDER_SITE_OTHER): Payer: BLUE CROSS/BLUE SHIELD | Admitting: Physician Assistant

## 2018-10-23 ENCOUNTER — Encounter: Payer: Self-pay | Admitting: Physician Assistant

## 2018-10-23 VITALS — BP 110/80 | HR 75 | Temp 98.3°F | Ht 72.0 in | Wt 215.2 lb

## 2018-10-23 DIAGNOSIS — M109 Gout, unspecified: Secondary | ICD-10-CM | POA: Diagnosis not present

## 2018-10-23 DIAGNOSIS — Z79899 Other long term (current) drug therapy: Secondary | ICD-10-CM | POA: Diagnosis not present

## 2018-10-23 DIAGNOSIS — Z136 Encounter for screening for cardiovascular disorders: Secondary | ICD-10-CM

## 2018-10-23 DIAGNOSIS — Z130 Encounter for screening for diseases of the blood and blood-forming organs and certain disorders involving the immune mechanism: Secondary | ICD-10-CM | POA: Diagnosis not present

## 2018-10-23 DIAGNOSIS — F411 Generalized anxiety disorder: Secondary | ICD-10-CM

## 2018-10-23 DIAGNOSIS — N138 Other obstructive and reflux uropathy: Secondary | ICD-10-CM

## 2018-10-23 DIAGNOSIS — Z1329 Encounter for screening for other suspected endocrine disorder: Secondary | ICD-10-CM | POA: Diagnosis not present

## 2018-10-23 DIAGNOSIS — Z13220 Encounter for screening for lipoid disorders: Secondary | ICD-10-CM

## 2018-10-23 DIAGNOSIS — Z0000 Encounter for general adult medical examination without abnormal findings: Secondary | ICD-10-CM

## 2018-10-23 DIAGNOSIS — Z6828 Body mass index (BMI) 28.0-28.9, adult: Secondary | ICD-10-CM

## 2018-10-23 DIAGNOSIS — Z125 Encounter for screening for malignant neoplasm of prostate: Secondary | ICD-10-CM | POA: Diagnosis not present

## 2018-10-23 DIAGNOSIS — Z131 Encounter for screening for diabetes mellitus: Secondary | ICD-10-CM

## 2018-10-23 DIAGNOSIS — E559 Vitamin D deficiency, unspecified: Secondary | ICD-10-CM

## 2018-10-23 DIAGNOSIS — Z1389 Encounter for screening for other disorder: Secondary | ICD-10-CM

## 2018-10-23 DIAGNOSIS — R300 Dysuria: Secondary | ICD-10-CM

## 2018-10-23 DIAGNOSIS — N401 Enlarged prostate with lower urinary tract symptoms: Secondary | ICD-10-CM | POA: Diagnosis not present

## 2018-10-23 DIAGNOSIS — R350 Frequency of micturition: Secondary | ICD-10-CM | POA: Diagnosis not present

## 2018-10-23 DIAGNOSIS — R21 Rash and other nonspecific skin eruption: Secondary | ICD-10-CM

## 2018-10-23 MED ORDER — TADALAFIL 20 MG PO TABS: 20.0000 mg | ORAL_TABLET | Freq: Every day | ORAL | 3 refills | Status: DC | PRN

## 2018-10-23 MED ORDER — FINASTERIDE 5 MG PO TABS: 5.0000 mg | ORAL_TABLET | Freq: Every day | ORAL | 3 refills | Status: DC

## 2018-10-23 MED ORDER — SERTRALINE HCL 50 MG PO TABS: 50.0000 mg | ORAL_TABLET | Freq: Every day | ORAL | 2 refills | Status: DC

## 2018-10-23 MED ORDER — NYSTATIN 100000 UNIT/GM EX POWD: CUTANEOUS | 2 refills | Status: DC

## 2018-10-23 NOTE — Patient Instructions (Addendum)
Counseling services  Try zoloft 50mg  start 1/2 pill x 2 weeks and then go up 1 pill for 2 weeks.  Can take at night  Here are some numbers below you can try but I suggest calling your insurance and finding out who is in your network and THEN calling those people or looking them up on google.   I'm a big fan of Cognitive Behavioral Therapy, look this up on You tube or check with the therapist you see if they are certified.  This form of therapy helps to teach you skills to better handle with current situation that are causing anxiety or depression.   We are starting you on a new medication. Here is some general information.   1) Medications are not always the solution, any medication we put you on there is always a hope to come off of it depending on the medication. For example, If we start you on a hypertension medication, I would love to get you off of it and we can address that every visit if you wish. I'm always willing to try to get you off a medication unless I really feel that it is beneficial for you.   2) With what I mentioned above, there is no magic pill, I need you to put in the work to get off any medication you wish to not be on. So things to help is move a little each day, drink plenty of water, eat veggies/fruit, and don't smoke.   3) Every medication has a potential for a side effect. Even over the counter medications have a potential side effect. So I start you on a medication and there is something different over the next 1-3 months let me know. It is always possible that it can be the medication.   Here is some information below about your new medication.  If you have any concerns or questions please contact the office and not Dr. Essie Hart. =) Remember also that during a study ANY symptoms someone has can be listed as a side effect even if it was not caused by the medication.   I suggest seeing a urologist Continue the Proscar  .

## 2018-10-24 LAB — LIPID PANEL
Cholesterol: 146 mg/dL (ref <200)
HDL: 59 mg/dL (ref >=40)
LDL CHOLESTEROL (CALC): 73 mg/dL
Non-HDL Cholesterol (Calc): 87 mg/dL (calc) (ref <130)
Total CHOL/HDL Ratio: 2.5 (calc) (ref <5.0)
Triglycerides: 68 mg/dL (ref <150)

## 2018-10-24 LAB — COMPLETE METABOLIC PANEL WITH GFR
AG Ratio: 2.4 (calc) (ref 1.0–2.5)
ALBUMIN MSPROF: 4.7 g/dL (ref 3.6–5.1)
ALKALINE PHOSPHATASE (APISO): 64 U/L (ref 36–130)
ALT: 21 U/L (ref 9–46)
AST: 21 U/L (ref 10–40)
BUN: 16 mg/dL (ref 7–25)
CO2: 27 mmol/L (ref 20–32)
CREATININE: 1.18 mg/dL (ref 0.60–1.35)
Calcium: 9.6 mg/dL (ref 8.6–10.3)
Chloride: 104 mmol/L (ref 98–110)
GFR, Est African American: 87 mL/min/{1.73_m2} (ref >=60)
GFR, Est Non African American: 75 mL/min/{1.73_m2} (ref >=60)
GLUCOSE: 84 mg/dL (ref 65–99)
Globulin: 2 g/dL (calc) (ref 1.9–3.7)
Potassium: 4.6 mmol/L (ref 3.5–5.3)
Sodium: 139 mmol/L (ref 135–146)
Total Bilirubin: 0.4 mg/dL (ref 0.2–1.2)
Total Protein: 6.7 g/dL (ref 6.1–8.1)

## 2018-10-24 LAB — MICROALBUMIN / CREATININE URINE RATIO
CREATININE, URINE: 92 mg/dL (ref 20–320)
Microalb Creat Ratio: 8 mcg/mg creat (ref <30)
Microalb, Ur: 0.7 mg/dL

## 2018-10-24 LAB — PSA

## 2018-10-24 LAB — TSH: TSH: 1.12 mIU/L (ref 0.40–4.50)

## 2018-10-24 LAB — CBC WITH DIFFERENTIAL/PLATELET
Absolute Monocytes: 578 cells/uL (ref 200–950)
Basophils Absolute: 53 cells/uL (ref 0–200)
Basophils Relative: 0.7 %
EOS PCT: 1.7 %
Eosinophils Absolute: 128 cells/uL (ref 15–500)
HCT: 42.1 % (ref 38.5–50.0)
HEMOGLOBIN: 14.6 g/dL (ref 13.2–17.1)
Lymphs Abs: 1673 cells/uL (ref 850–3900)
MCH: 30 pg (ref 27.0–33.0)
MCHC: 34.7 g/dL (ref 32.0–36.0)
MCV: 86.6 fL (ref 80.0–100.0)
MONOS PCT: 7.7 %
MPV: 10.8 fL (ref 7.5–12.5)
NEUTROS ABS: 5070 {cells}/uL (ref 1500–7800)
Neutrophils Relative %: 67.6 %
Platelets: 265 10*3/uL (ref 140–400)
RBC: 4.86 10*6/uL (ref 4.20–5.80)
RDW: 11.9 % (ref 11.0–15.0)
Total Lymphocyte: 22.3 %
WBC: 7.5 10*3/uL (ref 3.8–10.8)

## 2018-10-24 LAB — URINALYSIS, ROUTINE W REFLEX MICROSCOPIC
BILIRUBIN URINE: NEGATIVE
Glucose, UA: NEGATIVE
Hyaline Cast: NONE SEEN /LPF
Ketones, ur: NEGATIVE
NITRITE: POSITIVE — AB
Protein, ur: NEGATIVE
RBC / HPF: NONE SEEN /HPF (ref 0–2)
Specific Gravity, Urine: 1.015 (ref 1.001–1.03)
WBC, UA: >=60 /HPF — AB (ref 0–5)
pH: 6 (ref 5.0–8.0)

## 2018-10-24 LAB — VITAMIN D 25 HYDROXY (VIT D DEFICIENCY, FRACTURES): Vit D, 25-Hydroxy: 42 ng/mL (ref 30–100)

## 2018-10-24 LAB — IRON, TOTAL/TOTAL IRON BINDING CAP
%SAT: 23 % (calc) (ref 20–48)
IRON: 68 ug/dL (ref 50–180)
TIBC: 293 mcg/dL (calc) (ref 250–425)

## 2018-10-24 LAB — HEMOGLOBIN A1C
Hgb A1c MFr Bld: 4.8 % of total Hgb (ref <5.7)
Mean Plasma Glucose: 91 (calc)
eAG (mmol/L): 5 (calc)

## 2018-10-24 LAB — MAGNESIUM: MAGNESIUM: 2.1 mg/dL (ref 1.5–2.5)

## 2018-10-24 LAB — VITAMIN B12: Vitamin B-12: 844 pg/mL (ref 200–1100)

## 2018-10-24 MED ORDER — SULFAMETHOXAZOLE-TRIMETHOPRIM 800-160 MG PO TABS: 1.0000 | ORAL_TABLET | Freq: Two times a day (BID) | ORAL | 0 refills | Status: DC

## 2018-10-25 LAB — URINE CULTURE
MICRO NUMBER:: 222020
Result:: NO GROWTH
SPECIMEN QUALITY:: ADEQUATE

## 2018-10-27 MED ORDER — SULFAMETHOXAZOLE-TRIMETHOPRIM 800-160 MG PO TABS: 1.0000 | ORAL_TABLET | Freq: Two times a day (BID) | ORAL | 0 refills | Status: DC

## 2018-10-27 NOTE — Addendum Note (Signed)
Addended by: Vladimir Crofts on: 10/27/2018 04:57 PM   Modules accepted: Orders

## 2018-10-29 ENCOUNTER — Other Ambulatory Visit: Payer: Self-pay | Admitting: Physician Assistant

## 2018-10-29 DIAGNOSIS — N401 Enlarged prostate with lower urinary tract symptoms: Secondary | ICD-10-CM

## 2018-10-29 DIAGNOSIS — N138 Other obstructive and reflux uropathy: Secondary | ICD-10-CM

## 2018-11-25 ENCOUNTER — Other Ambulatory Visit: Payer: BLUE CROSS/BLUE SHIELD

## 2018-11-25 ENCOUNTER — Other Ambulatory Visit: Payer: Self-pay

## 2018-11-25 DIAGNOSIS — N401 Enlarged prostate with lower urinary tract symptoms: Secondary | ICD-10-CM | POA: Diagnosis not present

## 2018-11-25 DIAGNOSIS — R300 Dysuria: Secondary | ICD-10-CM

## 2018-11-27 LAB — URINALYSIS, ROUTINE W REFLEX MICROSCOPIC
Bacteria, UA: NONE SEEN /HPF
Bilirubin Urine: NEGATIVE
Glucose, UA: NEGATIVE
Hgb urine dipstick: NEGATIVE
Hyaline Cast: NONE SEEN /LPF
Ketones, ur: NEGATIVE
Nitrite: NEGATIVE
Protein, ur: NEGATIVE
RBC / HPF: NONE SEEN /HPF (ref 0–2)
Specific Gravity, Urine: 1.008 (ref 1.001–1.03)
Squamous Epithelial / HPF: NONE SEEN /HPF (ref <=5)
WBC, UA: NONE SEEN /HPF (ref 0–5)
pH: 7.5 (ref 5.0–8.0)

## 2018-11-27 LAB — URINE CULTURE
MICRO NUMBER:: 347425
Result:: NO GROWTH
SPECIMEN QUALITY:: ADEQUATE

## 2018-11-28 DIAGNOSIS — R310 Gross hematuria: Secondary | ICD-10-CM | POA: Diagnosis not present

## 2018-11-28 DIAGNOSIS — R3916 Straining to void: Secondary | ICD-10-CM | POA: Diagnosis not present

## 2018-11-28 DIAGNOSIS — N3289 Other specified disorders of bladder: Secondary | ICD-10-CM | POA: Diagnosis not present

## 2018-11-28 DIAGNOSIS — N35912 Unspecified bulbous urethral stricture, male: Secondary | ICD-10-CM | POA: Diagnosis not present

## 2018-11-29 ENCOUNTER — Other Ambulatory Visit (HOSPITAL_COMMUNITY): Payer: Self-pay | Admitting: Urology

## 2018-11-29 DIAGNOSIS — M9985 Other biomechanical lesions of pelvic region: Secondary | ICD-10-CM

## 2018-12-05 DIAGNOSIS — R310 Gross hematuria: Secondary | ICD-10-CM | POA: Diagnosis not present

## 2018-12-05 DIAGNOSIS — R3916 Straining to void: Secondary | ICD-10-CM | POA: Diagnosis not present

## 2018-12-05 DIAGNOSIS — N35914 Unspecified anterior urethral stricture, male: Secondary | ICD-10-CM | POA: Diagnosis not present

## 2018-12-16 ENCOUNTER — Ambulatory Visit (HOSPITAL_COMMUNITY)
Admission: RE | Admit: 2018-12-16 | Discharge: 2018-12-16 | Disposition: A | Discharge status: Home / Self Care | Payer: BLUE CROSS/BLUE SHIELD | Source: Ambulatory Visit | Attending: Urology | Admitting: Urology

## 2018-12-16 ENCOUNTER — Other Ambulatory Visit: Payer: Self-pay

## 2018-12-16 DIAGNOSIS — M9985 Other biomechanical lesions of pelvic region: Secondary | ICD-10-CM | POA: Diagnosis not present

## 2018-12-16 DIAGNOSIS — M8588 Other specified disorders of bone density and structure, other site: Secondary | ICD-10-CM | POA: Diagnosis not present

## 2018-12-16 MED ORDER — TECHNETIUM TC 99M MEDRONATE IV KIT
21.0000 | PACK | Freq: Once | INTRAVENOUS | Status: AC | PRN
  Administered 2018-12-16: 08:00:00 21 via INTRAVENOUS

## 2018-12-24 ENCOUNTER — Encounter (HOSPITAL_COMMUNITY): Payer: BLUE CROSS/BLUE SHIELD

## 2019-01-23 DIAGNOSIS — N135 Crossing vessel and stricture of ureter without hydronephrosis: Secondary | ICD-10-CM | POA: Diagnosis not present

## 2019-02-03 DIAGNOSIS — Z1159 Encounter for screening for other viral diseases: Secondary | ICD-10-CM | POA: Diagnosis not present

## 2019-02-03 DIAGNOSIS — Z03818 Encounter for observation for suspected exposure to other biological agents ruled out: Secondary | ICD-10-CM | POA: Diagnosis not present

## 2019-05-14 ENCOUNTER — Other Ambulatory Visit: Payer: Self-pay

## 2019-05-14 DIAGNOSIS — Z20822 Contact with and (suspected) exposure to covid-19: Secondary | ICD-10-CM

## 2019-05-14 DIAGNOSIS — Z20828 Contact with and (suspected) exposure to other viral communicable diseases: Secondary | ICD-10-CM | POA: Diagnosis not present

## 2019-05-14 DIAGNOSIS — R6889 Other general symptoms and signs: Secondary | ICD-10-CM | POA: Diagnosis not present

## 2019-05-15 LAB — NOVEL CORONAVIRUS, NAA: SARS-CoV-2, NAA: NOT DETECTED

## 2019-06-05 HISTORY — PX: URETHROPLASTY: SHX499

## 2019-06-09 DIAGNOSIS — Z1159 Encounter for screening for other viral diseases: Secondary | ICD-10-CM | POA: Diagnosis not present

## 2019-06-12 DIAGNOSIS — N390 Urinary tract infection, site not specified: Secondary | ICD-10-CM | POA: Diagnosis not present

## 2019-06-12 DIAGNOSIS — B954 Other streptococcus as the cause of diseases classified elsewhere: Secondary | ICD-10-CM | POA: Diagnosis not present

## 2019-06-13 DIAGNOSIS — N99111 Postprocedural bulbous urethral stricture: Secondary | ICD-10-CM | POA: Diagnosis not present

## 2019-06-13 DIAGNOSIS — N135 Crossing vessel and stricture of ureter without hydronephrosis: Secondary | ICD-10-CM | POA: Diagnosis not present

## 2019-06-17 DIAGNOSIS — N135 Crossing vessel and stricture of ureter without hydronephrosis: Secondary | ICD-10-CM | POA: Diagnosis not present

## 2019-06-30 DIAGNOSIS — N135 Crossing vessel and stricture of ureter without hydronephrosis: Secondary | ICD-10-CM | POA: Diagnosis not present

## 2019-07-14 DIAGNOSIS — N135 Crossing vessel and stricture of ureter without hydronephrosis: Secondary | ICD-10-CM | POA: Diagnosis not present

## 2019-07-23 ENCOUNTER — Other Ambulatory Visit: Payer: Self-pay | Admitting: Physician Assistant

## 2019-07-23 DIAGNOSIS — G8929 Other chronic pain: Secondary | ICD-10-CM

## 2019-07-23 NOTE — Progress Notes (Signed)
Continuing knee pain from Feb with conservative treatment, will refer.

## 2019-07-24 DIAGNOSIS — N135 Crossing vessel and stricture of ureter without hydronephrosis: Secondary | ICD-10-CM | POA: Diagnosis not present

## 2019-08-07 DIAGNOSIS — Z9189 Other specified personal risk factors, not elsewhere classified: Secondary | ICD-10-CM | POA: Diagnosis not present

## 2019-08-07 DIAGNOSIS — Z20828 Contact with and (suspected) exposure to other viral communicable diseases: Secondary | ICD-10-CM | POA: Diagnosis not present

## 2019-09-02 DIAGNOSIS — N135 Crossing vessel and stricture of ureter without hydronephrosis: Secondary | ICD-10-CM | POA: Diagnosis not present

## 2019-10-29 NOTE — Progress Notes (Signed)
Complete Physical  Assessment and Plan:  Routine general medical examination at a health care facility 1 year  Screening cholesterol level -     Lipid panel  Screening for blood or protein in urine -     Urinalysis, Routine w reflex microscopic -     Microalbumin / creatinine urine ratio  Medication management -     CBC with Differential/Platelet -     COMPLETE METABOLIC PANEL WITH GFR -     Magnesium  Vitamin D deficiency -     VITAMIN D 25 Hydroxy (Vit-D Deficiency, Fractures)  Generalized anxiety disorder -     TSH  BMI 28.0-28.9,adult  Generalized anxiety disorder -     TSH -     busPIRone (BUSPAR) 5 MG tablet; Take 1 tablet (5 mg total) by mouth 3 (three) times daily as needed (anxiety). - try to cut back on drinking, has 2 drinks a night for anxiety  Screen for colon cancer -     Fecal Globin By Immunochemistry   Discussed med's effects and SE's. Screening labs and tests as requested with regular follow-up as recommended. Over 40 minutes of exam, counseling, chart review and critical decision making was performed   HPI  This very nice 44 y.o.male presents for complete physical.  Patient has no major health issues.   He states he has had OCD when he leaves for vacation, has excessive worry.   Patient moved here recently, started as new patient, wife was in hospice at the time but has recently passed from ovarian cancer.    He does workout but cardio x 1 hour, has been running and doing incline. Has been having right quad tightness, has left knee pain.   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  Lab Results  Component Value Date   CHOL 146 10/23/2018   HDL 59 10/23/2018   LDLCALC 73 10/23/2018   TRIG 68 10/23/2018   CHOLHDL 2.5 10/23/2018   BMI is Body mass index is 27.53 kg/m., he is working on diet and exercise. Wt Readings from Last 3 Encounters:  10/30/19 203 lb (92.1 kg)  10/23/18 215 lb 3.2 oz (97.6 kg)  08/20/17 218 lb 9.6 oz (99.2 kg)      Current Medications:  Current Outpatient Medications on File Prior to Visit  Medication Sig Dispense Refill  . clobetasol (TEMOVATE) 0.05 % GEL Apply twice daily for 10 days 30 each 3  . erythromycin with ethanol (EMGEL) 2 % gel Apply topically daily. 30 g 0  . nystatin (MYCOSTATIN/NYSTOP) powder Apply daily after a shower as needed 60 g 2   No current facility-administered medications on file prior to visit.   Health Maintenance:    TD/TDAP: declines Influenza: declines Pneumovax: N/A Prevnar 13: N/A  Sexually Active: yes married  Medical History:  Past Medical History:  Diagnosis Date  . Anxiety   . Stricture, urethra    Dr. Risa Grill 2005   Allergies No Known Allergies  SURGICAL HISTORY He  has a past surgical history that includes Urethroplasty (2008) and Urethroplasty (06/2019). FAMILY HISTORY His family history includes Cancer (age of onset: 20) in his mother; Cancer (age of onset: 103) in his father; Proteinuria in his father. SOCIAL HISTORY He  reports that he has never smoked. He has never used smokeless tobacco. He reports current alcohol use. He reports that he does not use drugs.   Review of Systems: Review of Systems  Constitutional: Negative for chills, diaphoresis, fever, malaise/fatigue  and weight loss.  HENT: Negative for congestion, ear pain and sinus pain.   Eyes: Negative.   Respiratory: Negative.   Cardiovascular: Negative.   Gastrointestinal: Negative.   Genitourinary: Negative for dysuria, flank pain, frequency, hematuria and urgency.  Musculoskeletal: Negative for back pain, falls, joint pain, myalgias and neck pain.  Skin: Negative for rash.  Neurological: Negative.  Negative for weakness.  Endo/Heme/Allergies: Negative.   Psychiatric/Behavioral: Negative for depression, hallucinations, memory loss, substance abuse and suicidal ideas. The patient is nervous/anxious. The patient does not have insomnia.     Physical Exam: Estimated body  mass index is 27.53 kg/m as calculated from the following:   Height as of this encounter: 6' (1.829 m).   Weight as of this encounter: 203 lb (92.1 kg). BP 122/86   Pulse 67   Temp (!) 97.3 F (36.3 C)   Ht 6' (1.829 m)   Wt 203 lb (92.1 kg)   SpO2 98%   BMI 27.53 kg/m  General Appearance: Well nourished, in no apparent distress.  Eyes: PERRLA, EOMs, conjunctiva no swelling or erythema, normal fundi and vessels.  Sinuses: + Frontal/maxillary tenderness  ENT/Mouth: Ext aud canals clear, normal light reflex with TMs without erythema on the right, left ear with clear fluid line with erythematous cloudy fluid and bulging. Good dentition. No erythema, swelling, or exudate on post pharynx. Tonsils not swollen or erythematous. Hearing normal.  Neck: Supple, thyroid normal. No bruits  Respiratory: Respiratory effort normal, BS equal bilaterally without rales, rhonchi, wheezing or stridor.  Cardio: RRR without murmurs, rubs or gallops. Brisk peripheral pulses without edema.  Chest: symmetric, with normal excursions and percussion.  Abdomen: Soft, nontender, no guarding, rebound, hernias, masses, or organomegaly.  Lymphatics: Non tender without lymphadenopathy.  Genitourinary: defer Musculoskeletal: Full ROM all peripheral extremities,5/5 strength, and normal gait.  Skin: Bilateral axilla with well circumscribed shiny plaques that are red/brown, 3x3 cm Warm, dry without rashes, lesions, ecchymosis.See picture for moles to monitor on left lower back Neuro: Cranial nerves intact, reflexes equal bilaterally. Normal muscle tone, no cerebellar symptoms. Sensation intact.  Psych: Awake and oriented X 3, normal affect, Insight and Judgment appropriate.    EKG: questionable LVH versus body habitus, get CXR     Vicie Mutters 1:28 PM [facility]

## 2019-10-30 ENCOUNTER — Encounter: Payer: Self-pay | Admitting: Physician Assistant

## 2019-10-30 ENCOUNTER — Other Ambulatory Visit: Payer: Self-pay

## 2019-10-30 ENCOUNTER — Ambulatory Visit (INDEPENDENT_AMBULATORY_CARE_PROVIDER_SITE_OTHER): Payer: BC Managed Care – PPO | Admitting: Physician Assistant

## 2019-10-30 VITALS — BP 122/86 | HR 67 | Temp 97.3°F | Ht 72.0 in | Wt 203.0 lb

## 2019-10-30 DIAGNOSIS — Z79899 Other long term (current) drug therapy: Secondary | ICD-10-CM

## 2019-10-30 DIAGNOSIS — Z131 Encounter for screening for diabetes mellitus: Secondary | ICD-10-CM | POA: Diagnosis not present

## 2019-10-30 DIAGNOSIS — F411 Generalized anxiety disorder: Secondary | ICD-10-CM

## 2019-10-30 DIAGNOSIS — E559 Vitamin D deficiency, unspecified: Secondary | ICD-10-CM | POA: Diagnosis not present

## 2019-10-30 DIAGNOSIS — Z0001 Encounter for general adult medical examination with abnormal findings: Secondary | ICD-10-CM

## 2019-10-30 DIAGNOSIS — Z1389 Encounter for screening for other disorder: Secondary | ICD-10-CM

## 2019-10-30 DIAGNOSIS — Z1329 Encounter for screening for other suspected endocrine disorder: Secondary | ICD-10-CM | POA: Diagnosis not present

## 2019-10-30 DIAGNOSIS — Z13220 Encounter for screening for lipoid disorders: Secondary | ICD-10-CM

## 2019-10-30 DIAGNOSIS — Z0000 Encounter for general adult medical examination without abnormal findings: Secondary | ICD-10-CM | POA: Diagnosis not present

## 2019-10-30 DIAGNOSIS — Z130 Encounter for screening for diseases of the blood and blood-forming organs and certain disorders involving the immune mechanism: Secondary | ICD-10-CM

## 2019-10-30 DIAGNOSIS — Z1211 Encounter for screening for malignant neoplasm of colon: Secondary | ICD-10-CM

## 2019-10-30 MED ORDER — FINASTERIDE 5 MG PO TABS: 5.0000 mg | ORAL_TABLET | Freq: Every day | ORAL | 3 refills | Status: DC

## 2019-10-30 MED ORDER — BUSPIRONE HCL 5 MG PO TABS: 5.0000 mg | ORAL_TABLET | Freq: Three times a day (TID) | ORAL | 0 refills | Status: DC | PRN

## 2019-10-30 MED ORDER — TADALAFIL 20 MG PO TABS: 20.0000 mg | ORAL_TABLET | Freq: Every day | ORAL | 3 refills | Status: AC | PRN

## 2019-10-30 NOTE — Patient Instructions (Addendum)
Aleve, ibuprofen is an antiinflammatory You can take tylenol (500mg ) or tylenol arthritis (650mg ) with the meloxicam/antiinflammatories. The max you can take of tylenol a day is 3000mg  daily, this is a max of 6 pills a day of the regular tyelnol (500mg ) or a max of 4 a day of the tylenol arthritis (650mg ) as long as no other medications you are taking contain tylenol.   this can cause inflammation in your stomach and can cause ulcers or bleeding, this will look like black tarry stools Make sure you taking it with food Try not to take it daily, take AS needed Can take with pecid  Can refer to PT for knee and hamstring/working out  Can try the buspar 1/2-1 up to 3 x a day, last about 6 hours.   Can do FIT test or can sign you up for colonoscopy   voltern gel over the counter  Willie Romero and [place] CBD topical oil check online

## 2019-10-31 LAB — COMPLETE METABOLIC PANEL WITH GFR
AG Ratio: 1.9 (calc) (ref 1.0–2.5)
ALT: 23 U/L (ref 9–46)
AST: 31 U/L (ref 10–40)
Albumin: 4.9 g/dL (ref 3.6–5.1)
Alkaline phosphatase (APISO): 67 U/L (ref 36–130)
BUN/Creatinine Ratio: 20 (calc) (ref 6–22)
BUN: 26 mg/dL — ABNORMAL HIGH (ref 7–25)
CO2: 27 mmol/L (ref 20–32)
Calcium: 10.3 mg/dL (ref 8.6–10.3)
Chloride: 102 mmol/L (ref 98–110)
Creat: 1.27 mg/dL (ref 0.60–1.35)
GFR, Est African American: 79 mL/min/{1.73_m2} (ref >=60)
GFR, Est Non African American: 68 mL/min/{1.73_m2} (ref >=60)
Globulin: 2.6 g/dL (calc) (ref 1.9–3.7)
Glucose, Bld: 81 mg/dL (ref 65–99)
Potassium: 5 mmol/L (ref 3.5–5.3)
Sodium: 138 mmol/L (ref 135–146)
Total Bilirubin: 0.7 mg/dL (ref 0.2–1.2)
Total Protein: 7.5 g/dL (ref 6.1–8.1)

## 2019-10-31 LAB — LIPID PANEL
Cholesterol: 171 mg/dL (ref <200)
HDL: 81 mg/dL (ref >=40)
LDL Cholesterol (Calc): 77 mg/dL (calc)
Non-HDL Cholesterol (Calc): 90 mg/dL (calc) (ref <130)
Total CHOL/HDL Ratio: 2.1 (calc) (ref <5.0)
Triglycerides: 52 mg/dL (ref <150)

## 2019-10-31 LAB — CBC WITH DIFFERENTIAL/PLATELET
Absolute Monocytes: 703 cells/uL (ref 200–950)
Basophils Absolute: 48 cells/uL (ref 0–200)
Basophils Relative: 0.5 %
Eosinophils Absolute: 29 cells/uL (ref 15–500)
Eosinophils Relative: 0.3 %
HCT: 44.4 % (ref 38.5–50.0)
Hemoglobin: 15.4 g/dL (ref 13.2–17.1)
Lymphs Abs: 1891 cells/uL (ref 850–3900)
MCH: 30.4 pg (ref 27.0–33.0)
MCHC: 34.7 g/dL (ref 32.0–36.0)
MCV: 87.7 fL (ref 80.0–100.0)
MPV: 10.3 fL (ref 7.5–12.5)
Monocytes Relative: 7.4 %
Neutro Abs: 6831 cells/uL (ref 1500–7800)
Neutrophils Relative %: 71.9 %
Platelets: 280 10*3/uL (ref 140–400)
RBC: 5.06 10*6/uL (ref 4.20–5.80)
RDW: 12.2 % (ref 11.0–15.0)
Total Lymphocyte: 19.9 %
WBC: 9.5 10*3/uL (ref 3.8–10.8)

## 2019-10-31 LAB — MAGNESIUM: Magnesium: 2.3 mg/dL (ref 1.5–2.5)

## 2019-10-31 LAB — URINALYSIS, ROUTINE W REFLEX MICROSCOPIC
Bilirubin Urine: NEGATIVE
Glucose, UA: NEGATIVE
Hgb urine dipstick: NEGATIVE
Leukocytes,Ua: NEGATIVE
Nitrite: NEGATIVE
Protein, ur: NEGATIVE
Specific Gravity, Urine: 1.017 (ref 1.001–1.03)
pH: 5.5 (ref 5.0–8.0)

## 2019-10-31 LAB — VITAMIN D 25 HYDROXY (VIT D DEFICIENCY, FRACTURES): Vit D, 25-Hydroxy: 29 ng/mL — ABNORMAL LOW (ref 30–100)

## 2019-10-31 LAB — TSH: TSH: 1.33 mIU/L (ref 0.40–4.50)

## 2019-10-31 LAB — MICROALBUMIN / CREATININE URINE RATIO
Creatinine, Urine: 69 mg/dL (ref 20–320)
Microalb Creat Ratio: 3 mcg/mg creat (ref <30)
Microalb, Ur: 0.2 mg/dL

## 2019-10-31 LAB — HEMOGLOBIN A1C
Hgb A1c MFr Bld: 4.8 % of total Hgb (ref <5.7)
Mean Plasma Glucose: 91 (calc)
eAG (mmol/L): 5 (calc)

## 2019-11-05 MED ORDER — VITAMIN D (ERGOCALCIFEROL) 1.25 MG (50000 UNIT) PO CAPS: ORAL_CAPSULE | ORAL | 0 refills | Status: DC

## 2019-11-18 MED ORDER — VITAMIN D (ERGOCALCIFEROL) 1.25 MG (50000 UNIT) PO CAPS: ORAL_CAPSULE | ORAL | 1 refills | Status: DC

## 2019-11-19 MED ORDER — BUSPIRONE HCL 5 MG PO TABS: 5.0000 mg | ORAL_TABLET | Freq: Three times a day (TID) | ORAL | 0 refills | Status: DC | PRN

## 2019-11-24 IMAGING — NM NUCLEAR MEDICINE WHOLE BODY BONE SCINTIGRAPHY
2 series · 2 of 2 positions shown · non-contrast
Comparison: CT scan of the abdomen and pelvis 11/28/2018

CLINICAL DATA: 42-year-old male with multiple small lucent lesions
identified in the bony pelvis seen on recent CT imaging

EXAM:
NUCLEAR MEDICINE WHOLE BODY BONE SCAN
TECHNIQUE: Whole body anterior and posterior images were obtained approximately
3 hours after intravenous injection of radiopharmaceutical.
RADIOPHARMACEUTICALS:  Twenty-one mCi Nechnetium-SSm MDP IV

[Series 1: whole body · 2.66mm/px · 1 of 1 slices shown (1 of 2)]
[im 1/1]
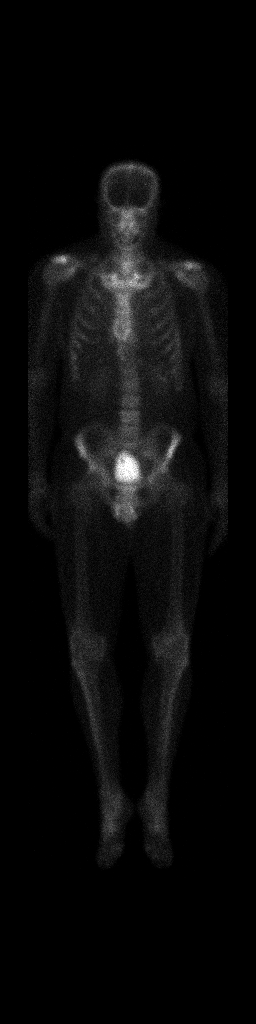

[Series 1: whole body · 2.66mm/px · 1 of 1 slices shown (2 of 2)]
[im 1/1]
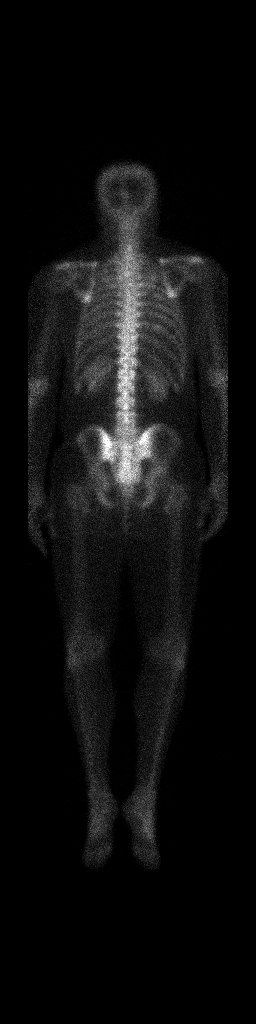

[2 of 2 positions shown; findings below may reference images not displayed]

FINDINGS: Relatively symmetric radiotracer uptake throughout the axial and
appendicular skeleton with small foci of increased radiotracer
uptake in the bilateral acromioclavicular and sternoclavicular
joints as well as within the sacroiliac joints. The symmetry of
these findings is most consistent with degenerative change. No focal
isolated region of increased uptake to suggest metastatic disease.
Normal radiotracer accumulation within the renal parenchyma and
collecting system.
IMPRESSION: Negative for evidence of focal osseous abnormality.

Mild symmetric bilateral degenerative changes as above.

## 2019-12-04 MED ORDER — BUSPIRONE HCL 10 MG PO TABS: 10.0000 mg | ORAL_TABLET | Freq: Three times a day (TID) | ORAL | 0 refills | Status: DC | PRN

## 2019-12-19 DIAGNOSIS — Z03818 Encounter for observation for suspected exposure to other biological agents ruled out: Secondary | ICD-10-CM | POA: Diagnosis not present

## 2020-01-02 ENCOUNTER — Ambulatory Visit (INDEPENDENT_AMBULATORY_CARE_PROVIDER_SITE_OTHER): Payer: BC Managed Care – PPO | Admitting: Physician Assistant

## 2020-01-02 ENCOUNTER — Other Ambulatory Visit: Payer: Self-pay

## 2020-01-02 VITALS — BP 132/78 | HR 68 | Temp 97.6°F | Resp 16 | Wt 199.0 lb

## 2020-01-02 DIAGNOSIS — Z1159 Encounter for screening for other viral diseases: Secondary | ICD-10-CM | POA: Diagnosis not present

## 2020-01-02 DIAGNOSIS — L739 Follicular disorder, unspecified: Secondary | ICD-10-CM

## 2020-01-02 MED ORDER — CIPROFLOXACIN HCL 500 MG PO TABS: 500.0000 mg | ORAL_TABLET | Freq: Two times a day (BID) | ORAL | 0 refills | Status: AC

## 2020-01-02 NOTE — Progress Notes (Signed)
   Subjective:    Patient ID: Willie Romero, male    DOB: [birth year], 44 y.o.   MRN: [id number]  HPI 44 y.o. WM recently back from [place], returned Monday night.  Started with "prickly sensation" on chest, then back, arms with fine rash.  He did get in a hot tub while there.  No fever, no chills, no diarrhea, constipation, no nausea, vomiting, no sore throat.   Brother died with Hep C at 44- wants screened  Blood pressure 132/78, pulse 68, temperature 97.6 F (36.4 C), resp. rate 16, weight 199 lb (90.3 kg).  Medications   Current Outpatient Medications (Cardiovascular):  .  tadalafil (CIALIS) 20 MG tablet, Take 1 tablet (20 mg total) by mouth daily as needed for erectile dysfunction.     Current Outpatient Medications (Other):  .  busPIRone (BUSPAR) 10 MG tablet, Take 1 tablet (10 mg total) by mouth 3 (three) times daily as needed (anxiety). .  finasteride (PROSCAR) 5 MG tablet, Take 1 tablet (5 mg total) by mouth daily. Marland Kitchen  nystatin (MYCOSTATIN/NYSTOP) powder, Apply daily after a shower as needed .  Vitamin D, Ergocalciferol, (DRISDOL) 1.25 MG (50000 UNIT) CAPS capsule, 1 pill once a week for vitamin d deficiency  Problem list He does not have a problem list on file.   Review of Systems  Constitutional: Negative.  Negative for chills and fever.  HENT: Negative.  Negative for congestion and sore throat.   Respiratory: Negative.  Negative for shortness of breath.   Cardiovascular: Negative.  Negative for chest pain and leg swelling.  Gastrointestinal: Negative.  Negative for diarrhea and nausea.  Genitourinary: Negative.   Musculoskeletal: Negative.  Negative for arthralgias.  Skin: Positive for rash.  Neurological: Negative.   Psychiatric/Behavioral: Negative.        Objective:   Physical Exam Constitutional:      Appearance: Normal appearance.  HENT:     Head: Normocephalic and atraumatic.     Mouth/Throat:     Mouth: Mucous membranes are dry.  Cardiovascular:      Rate and Rhythm: Normal rate and regular rhythm.  Pulmonary:     Effort: Pulmonary effort is normal.     Breath sounds: Normal breath sounds.  Abdominal:     General: Abdomen is flat.     Palpations: Abdomen is soft.     Tenderness: There is no abdominal tenderness.  Skin:    Findings: Rash present.     Comments: Papular rash at hair follicles on back and chest  Neurological:     Mental Status: He is alert.           Assessment & Plan:   Dock was seen today for rash.  Diagnoses and all orders for this visit:  Need for hepatitis C screening test -     Hepatitis C antibody  Folliculitis -     CBC with Differential/Platelet -     COMPLETE METABOLIC PANEL WITH GFR -     ciprofloxacin (CIPRO) 500 MG tablet; Take 1 tablet (500 mg total) by mouth 2 (two) times daily for 5 days. Given hand out Warm compresses

## 2020-01-02 NOTE — Patient Instructions (Signed)
Hot Tub Folliculitis  Folliculitis is inflammation of the hair follicles. Folliculitis most commonly occurs on the thighs, legs, chest, back, and buttocks. However, it can occur anywhere on the body. What are the causes? This condition may be caused by:  A bacterial infection (common).  A fungal infection.  A viral infection.  Contact with certain chemicals, especially oils and tars.  Shaving or waxing.  Greasy ointments or creams applied to the skin. Long-lasting folliculitis and folliculitis that keeps coming back may be caused by bacteria. This bacteria can live anywhere on your skin and is often found in the nostrils. What increases the risk? You are more likely to develop this condition if you have:  A weakened immune system.  Diabetes.  Obesity. What are the signs or symptoms? Symptoms of this condition include:  Redness.  Soreness.  Swelling.  Itching.  Small white or yellow, pus-filled, itchy spots (pustules) that appear over a reddened area. If there is an infection that goes deep into the follicle, these may develop into a boil (furuncle).  A group of closely packed boils (carbuncle). These tend to form in hairy, sweaty areas of the body. How is this diagnosed? This condition is diagnosed with a skin exam. To find what is causing the condition, your health care provider may take a sample of one of the pustules or boils for testing in a lab. How is this treated? This condition may be treated by:  Applying warm compresses to the affected areas.  Taking an antibiotic medicine or applying an antibiotic medicine to the skin.  Applying or bathing with an antiseptic solution/benzoyl peroxide/oxy pads.  Taking an over-the-counter medicine to help with itching.  Having a procedure to drain any pustules or boils. This may be done if a pustule or boil contains a lot of pus or fluid.  Having laser hair removal. This may be done to treat long-lasting  folliculitis. Follow these instructions at home: Managing pain and swelling   If directed, apply heat to the affected area as often as told by your health care provider. Use the heat source that your health care provider recommends, such as a moist heat pack or a heating pad. ? Place a towel between your skin and the heat source. ? Leave the heat on for 20-30 minutes. ? Remove the heat if your skin turns bright red. This is especially important if you are unable to feel pain, heat, or cold. You may have a greater risk of getting burned. General instructions  If you were prescribed an antibiotic medicine, take it or apply it as told by your health care provider. Do not stop using the antibiotic even if your condition improves.  Check the irritated area every day for signs of infection. Check for: ? Redness, swelling, or pain. ? Fluid or blood. ? Warmth. ? Pus or a bad smell.  Do not shave irritated skin.  Take over-the-counter and prescription medicines only as told by your health care provider.  Keep all follow-up visits as told by your health care provider. This is important. Get help right away if:  You have more redness, swelling, or pain in the affected area.  Red streaks are spreading from the affected area.  You have a fever. Summary  Folliculitis is inflammation of the hair follicles. Folliculitis most commonly occurs on the scalp, thighs, legs, back, and buttocks.  This condition may be treated by taking an antibiotic medicine or applying an antibiotic medicine to the skin, and applying or  bathing with an antiseptic solution.  If you were prescribed an antibiotic medicine, take it or apply it as told by your health care provider. Do not stop using the antibiotic even if your condition improves.  Get help right away if you have new or worsening symptoms.  Keep all follow-up visits as told by your health care provider. This is important. This information is not  intended to replace advice given to you by your health care provider. Make sure you discuss any questions you have with your health care provider. Document Revised: 03/30/2018 Document Reviewed: 03/30/2018 Elsevier Patient Education  [place].

## 2020-01-05 LAB — COMPLETE METABOLIC PANEL WITH GFR
AG Ratio: 2.1 (calc) (ref 1.0–2.5)
ALT: 20 U/L (ref 9–46)
AST: 24 U/L (ref 10–40)
Albumin: 4.6 g/dL (ref 3.6–5.1)
Alkaline phosphatase (APISO): 69 U/L (ref 36–130)
BUN/Creatinine Ratio: 20 (calc) (ref 6–22)
BUN: 26 mg/dL — ABNORMAL HIGH (ref 7–25)
CO2: 25 mmol/L (ref 20–32)
Calcium: 9.6 mg/dL (ref 8.6–10.3)
Chloride: 104 mmol/L (ref 98–110)
Creat: 1.33 mg/dL (ref 0.60–1.35)
GFR, Est African American: 75 mL/min/{1.73_m2} (ref >=60)
GFR, Est Non African American: 65 mL/min/{1.73_m2} (ref >=60)
Globulin: 2.2 g/dL (calc) (ref 1.9–3.7)
Glucose, Bld: 81 mg/dL (ref 65–99)
Potassium: 4.5 mmol/L (ref 3.5–5.3)
Sodium: 140 mmol/L (ref 135–146)
Total Bilirubin: 0.5 mg/dL (ref 0.2–1.2)
Total Protein: 6.8 g/dL (ref 6.1–8.1)

## 2020-01-05 LAB — CBC WITH DIFFERENTIAL/PLATELET
Absolute Monocytes: 646 cells/uL (ref 200–950)
Basophils Absolute: 61 cells/uL (ref 0–200)
Basophils Relative: 0.6 %
Eosinophils Absolute: 121 cells/uL (ref 15–500)
Eosinophils Relative: 1.2 %
HCT: 44.6 % (ref 38.5–50.0)
Hemoglobin: 15.1 g/dL (ref 13.2–17.1)
Lymphs Abs: 1778 cells/uL (ref 850–3900)
MCH: 30.3 pg (ref 27.0–33.0)
MCHC: 33.9 g/dL (ref 32.0–36.0)
MCV: 89.4 fL (ref 80.0–100.0)
MPV: 10.3 fL (ref 7.5–12.5)
Monocytes Relative: 6.4 %
Neutro Abs: 7494 cells/uL (ref 1500–7800)
Neutrophils Relative %: 74.2 %
Platelets: 278 10*3/uL (ref 140–400)
RBC: 4.99 10*6/uL (ref 4.20–5.80)
RDW: 12 % (ref 11.0–15.0)
Total Lymphocyte: 17.6 %
WBC: 10.1 10*3/uL (ref 3.8–10.8)

## 2020-01-05 LAB — HEPATITIS C ANTIBODY
Hepatitis C Ab: NONREACTIVE
SIGNAL TO CUT-OFF: 0.02 (ref <1.00)

## 2020-01-16 DIAGNOSIS — R310 Gross hematuria: Secondary | ICD-10-CM | POA: Diagnosis not present

## 2020-01-16 DIAGNOSIS — N390 Urinary tract infection, site not specified: Secondary | ICD-10-CM | POA: Diagnosis not present

## 2020-01-16 DIAGNOSIS — N3001 Acute cystitis with hematuria: Secondary | ICD-10-CM | POA: Diagnosis not present

## 2020-01-16 DIAGNOSIS — B9620 Unspecified Escherichia coli [E. coli] as the cause of diseases classified elsewhere: Secondary | ICD-10-CM | POA: Diagnosis not present

## 2020-01-28 DIAGNOSIS — N390 Urinary tract infection, site not specified: Secondary | ICD-10-CM | POA: Diagnosis not present

## 2020-01-28 DIAGNOSIS — B9620 Unspecified Escherichia coli [E. coli] as the cause of diseases classified elsewhere: Secondary | ICD-10-CM | POA: Diagnosis not present

## 2020-03-01 DIAGNOSIS — R8271 Bacteriuria: Secondary | ICD-10-CM | POA: Diagnosis not present

## 2020-03-16 ENCOUNTER — Other Ambulatory Visit: Payer: Self-pay | Admitting: Physician Assistant

## 2020-03-18 DIAGNOSIS — Z20822 Contact with and (suspected) exposure to covid-19: Secondary | ICD-10-CM | POA: Diagnosis not present

## 2020-03-18 DIAGNOSIS — Z03818 Encounter for observation for suspected exposure to other biological agents ruled out: Secondary | ICD-10-CM | POA: Diagnosis not present

## 2020-04-22 ENCOUNTER — Other Ambulatory Visit: Payer: Self-pay | Admitting: Adult Health

## 2020-05-03 ENCOUNTER — Ambulatory Visit (INDEPENDENT_AMBULATORY_CARE_PROVIDER_SITE_OTHER): Payer: BC Managed Care – PPO | Admitting: Adult Health

## 2020-05-03 ENCOUNTER — Other Ambulatory Visit: Payer: Self-pay

## 2020-05-03 ENCOUNTER — Encounter: Payer: Self-pay | Admitting: Adult Health

## 2020-05-03 VITALS — BP 120/76 | HR 62 | Temp 97.5°F | Wt 194.6 lb

## 2020-05-03 DIAGNOSIS — R590 Localized enlarged lymph nodes: Secondary | ICD-10-CM | POA: Diagnosis not present

## 2020-05-03 MED ORDER — PREDNISONE 20 MG PO TABS: ORAL_TABLET | ORAL | 0 refills | Status: DC
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
## 2020-05-18 NOTE — Telephone Encounter (Signed)
Patient has replied back to provider VIA mychart

## 2020-05-18 NOTE — Telephone Encounter (Signed)
-----   Message from [name], NP sent at 05/18/2020  7:36 AM EDT ----- Regarding: FW: chest lumps resolved? One of Amanda's patients that I saw a few weeks ago - I've reached out to him via mychart without response - please inquire did the chest lumps resolve? Thanks! ----- Message ----- From: [name], NP Sent: 05/17/2020 To: [name], NP Subject: chest lumps resolved?

## 2020-06-17 DIAGNOSIS — Z03818 Encounter for observation for suspected exposure to other biological agents ruled out: Secondary | ICD-10-CM | POA: Diagnosis not present

## 2020-06-17 DIAGNOSIS — Z20822 Contact with and (suspected) exposure to covid-19: Secondary | ICD-10-CM | POA: Diagnosis not present

## 2020-08-20 DIAGNOSIS — Z03818 Encounter for observation for suspected exposure to other biological agents ruled out: Secondary | ICD-10-CM | POA: Diagnosis not present

## 2020-08-20 DIAGNOSIS — Z20822 Contact with and (suspected) exposure to covid-19: Secondary | ICD-10-CM | POA: Diagnosis not present

## 2020-10-29 ENCOUNTER — Other Ambulatory Visit: Payer: Self-pay

## 2020-10-29 ENCOUNTER — Encounter: Payer: Self-pay | Admitting: Adult Health

## 2020-10-29 ENCOUNTER — Ambulatory Visit (INDEPENDENT_AMBULATORY_CARE_PROVIDER_SITE_OTHER): Payer: BC Managed Care – PPO | Admitting: Adult Health

## 2020-10-29 VITALS — BP 116/68 | HR 81 | Temp 97.7°F | Ht 72.0 in | Wt 207.0 lb

## 2020-10-29 DIAGNOSIS — Z0001 Encounter for general adult medical examination with abnormal findings: Secondary | ICD-10-CM

## 2020-10-29 DIAGNOSIS — Z1389 Encounter for screening for other disorder: Secondary | ICD-10-CM | POA: Diagnosis not present

## 2020-10-29 DIAGNOSIS — Z13220 Encounter for screening for lipoid disorders: Secondary | ICD-10-CM | POA: Diagnosis not present

## 2020-10-29 DIAGNOSIS — N529 Male erectile dysfunction, unspecified: Secondary | ICD-10-CM

## 2020-10-29 DIAGNOSIS — Z0000 Encounter for general adult medical examination without abnormal findings: Secondary | ICD-10-CM | POA: Diagnosis not present

## 2020-10-29 DIAGNOSIS — E559 Vitamin D deficiency, unspecified: Secondary | ICD-10-CM | POA: Diagnosis not present

## 2020-10-29 DIAGNOSIS — Z1211 Encounter for screening for malignant neoplasm of colon: Secondary | ICD-10-CM

## 2020-10-29 DIAGNOSIS — D229 Melanocytic nevi, unspecified: Secondary | ICD-10-CM

## 2020-10-29 DIAGNOSIS — F419 Anxiety disorder, unspecified: Secondary | ICD-10-CM | POA: Insufficient documentation

## 2020-10-29 DIAGNOSIS — Z79899 Other long term (current) drug therapy: Secondary | ICD-10-CM

## 2020-10-29 DIAGNOSIS — Z1329 Encounter for screening for other suspected endocrine disorder: Secondary | ICD-10-CM | POA: Diagnosis not present

## 2020-10-29 NOTE — Patient Instructions (Addendum)
Willie Romero , Thank you for taking time to come for your Annual Wellness Visit. I appreciate your ongoing commitment to your health goals. Please review the following plan we discussed and let me know if I can assist you in the future.   These are the goals we discussed: Goals    . Reduce alcohol intake       This is a list of the screening recommended for you and due dates:  Health Maintenance  Topic Date Due  . COVID-19 Vaccine (1) Never done  . Tetanus Vaccine  Never done  . Colon Cancer Screening  Never done  . Flu Shot  12/02/2020*  .  Hepatitis C: One time screening is recommended by Center for Disease Control  (CDC) for  adults born from [birth year] through 1966.   Completed  . HIV Screening  Discontinued  *Topic was postponed. The date shown is not the original due date.    Please sent covid 19 vaccine information   Know what a healthy weight is for you (roughly BMI <25) and aim to maintain this  Aim for 7+ servings of fruits and vegetables daily  65-80+ fluid ounces of water or unsweet tea for healthy kidneys  Limit to max 1 drink of alcohol per day; avoid smoking/tobacco  Limit animal fats in diet for cholesterol and heart health - choose grass fed whenever available  Avoid highly processed foods, and foods high in saturated/trans fats  Aim for low stress - take time to unwind and care for your mental health  Aim for 150 min of moderate intensity exercise weekly for heart health, and weights twice weekly for bone health  Aim for 7-9 hours of sleep daily    Can try melatonin 5mg -15 mg at night for sleep, can also do benadryl 25-50mg  at night for sleep.  If this does not help we can try prescription medication.  Also here is some information about good sleep hygiene.   Insomnia Insomnia is frequent trouble falling and/or staying asleep. Insomnia can be a long term problem or a short term problem. Both are common. Insomnia can be a short term problem when the  wakefulness is related to a certain stress or worry. Long term insomnia is often related to ongoing stress during waking hours and/or poor sleeping habits. Overtime, sleep deprivation itself can make the problem worse. Every little thing feels more severe because you are overtired and your ability to cope is decreased. CAUSES   Stress, anxiety, and depression.  Poor sleeping habits.  Distractions such as TV in the bedroom.  Naps close to bedtime.  Engaging in emotionally charged conversations before bed.  Technical reading before sleep.  Alcohol and other sedatives. They may make the problem worse. They can hurt normal sleep patterns and normal dream activity.  Stimulants such as caffeine for several hours prior to bedtime.  Pain syndromes and shortness of breath can cause insomnia.  Exercise late at night.  Changing time zones may cause sleeping problems (jet lag). It is sometimes helpful to have someone observe your sleeping patterns. They should look for periods of not breathing during the night (sleep apnea). They should also look to see how long those periods last. If you live alone or observers are uncertain, you can also be observed at a sleep clinic where your sleep patterns will be professionally monitored. Sleep apnea requires a checkup and treatment. Give your caregivers your medical history. Give your caregivers observations your family has made about your sleep.  SYMPTOMS   Not feeling rested in the morning.  Anxiety and restlessness at bedtime.  Difficulty falling and staying asleep. TREATMENT   Your caregiver may prescribe treatment for an underlying medical disorders. Your caregiver can give advice or help if you are using alcohol or other drugs for self-medication. Treatment of underlying problems will usually eliminate insomnia problems.  Medications can be prescribed for short time use. They are generally not recommended for lengthy use.  Over-the-counter  sleep medicines are not recommended for lengthy use. They can be habit forming.  You can promote easier sleeping by making lifestyle changes such as:  Using relaxation techniques that help with breathing and reduce muscle tension.  Exercising earlier in the day.  Changing your diet and the time of your last meal. No night time snacks.  Establish a regular time to go to bed.  Counseling can help with stressful problems and worry.  Soothing music and white noise may be helpful if there are background noises you cannot remove.  Stop tedious detailed work at least one hour before bedtime. HOME CARE INSTRUCTIONS   Keep a diary. Inform your caregiver about your progress. This includes any medication side effects. See your caregiver regularly. Take note of:  Times when you are asleep.  Times when you are awake during the night.  The quality of your sleep.  How you feel the next day. This information will help your caregiver care for you.  Get out of bed if you are still awake after 15 minutes. Read or do some quiet activity. Keep the lights down. Wait until you feel sleepy and go back to bed.  Keep regular sleeping and waking hours. Avoid naps.  Exercise regularly.  Avoid distractions at bedtime. Distractions include watching television or engaging in any intense or detailed activity like attempting to balance the household checkbook.  Develop a bedtime ritual. Keep a familiar routine of bathing, brushing your teeth, climbing into bed at the same time each night, listening to soothing music. Routines increase the success of falling to sleep faster.  Use relaxation techniques. This can be using breathing and muscle tension release routines. It can also include visualizing peaceful scenes. You can also help control troubling or intruding thoughts by keeping your mind occupied with boring or repetitive thoughts like the old concept of counting sheep. You can make it more creative like  imagining planting one beautiful flower after another in your backyard garden.  During your day, work to eliminate stress. When this is not possible use some of the previous suggestions to help reduce the anxiety that accompanies stressful situations. MAKE SURE YOU:   Understand these instructions.  Will watch your condition.  Will get help right away if you are not doing well or get worse. Document Released: 08/18/2000 Document Revised: 11/13/2011 Document Reviewed: 09/18/2007 [facility] Patient Information 2015 [place], [place]. This information is not intended to replace advice given to you by your health care provider. Make sure you discuss any questions you have with your health care provider.

## 2020-10-29 NOTE — Progress Notes (Signed)
Complete Physical  Assessment and Plan:  Routine general medical examination at a health care facility 1 year  Screening cholesterol level -    Has been normal, defer this year  Screening diabetes       -     Very low A1C, good lifestyle - defer       -     CMP for blood sugar  Screening for blood or protein in urine -     Urinalysis, Routine w reflex microscopic  Medication management -     CBC with Differential/Platelet -     COMPLETE METABOLIC PANEL WITH GFR -     Magnesium  Vitamin D deficiency -     VITAMIN D 25 Hydroxy (Vit-D Deficiency, Fractures)  Generalized anxiety disorder -     busPIRone (BUSPAR) 5 MG tablet; Take 1 tablet (5 mg total) by mouth 3 (three) times daily as needed (anxiety). - try to cut back on drinking, has 2 drinks a night for anxiety -     TSH  BMI 28.0-28.9,adult Long discussion about weight loss, diet, and exercise Recommended diet heavy in fruits and veggies and low in animal meats, cheeses, and dairy products, appropriate calorie intake Try hand held fat % monitor Monitor Labs have been well controlled  Multiple atypical nevi       -     Several needing biopsy vs excision, close monitoring, referral placed to derm after discussion  Screen for colon cancer -     Referral gastroenterology    Discussed med's effects and SE's. Screening labs and tests as requested with regular follow-up as recommended. Over 40 minutes of exam, counseling, chart review and critical decision making was performed  Future Appointments  Date Time Provider [place]  10/31/2021 10:00 AM Willie Comber, NP [facility] None     HPI  This very nice 45 y.o.male presents for complete physical.  Patient is generally healthy. He has Anxiety; Multiple atypical nevi; and Erectile dysfunction on their problem list.   His first wife passed with ovarian cancer., 70 y/o daughter from first marriage. He is remarried, Willie Romero, 60, no kids (doesn't want any). He  works as Biochemist, clinical for [employer]. [employer] job, but has been doing for 20 years, causes some stress. Wife owns travel agency, has considered quitting and working with her.   He has reported prone to anxiety, will worry and obsessively check his home for safety prior to leaving. Feels work related, worse during the week, does manage with buspar and adjusts dose as needed.   BMI is Body mass index is 28.07 kg/m., he has been working on diet and exercise. Drinks mix of water and soda/diet, discussed reducing. Otherwise generally healthy diet. Does report 2 drinks nightly, receptive to decreasing.  Wt Readings from Last 3 Encounters:  10/29/20 207 lb (93.9 kg)  05/03/20 194 lb 9.6 oz (88.3 kg)  01/02/20 199 lb (90.3 kg)   Today their BP is BP: 116/68  He does workout. He denies chest pain, shortness of breath, dizziness.   He does not have hx of cholesterol problems. His cholesterol is at goal. The cholesterol last visit was:   Lab Results  Component Value Date   CHOL 171 10/30/2019   HDL 81 10/30/2019   LDLCALC 77 10/30/2019   TRIG 52 10/30/2019   CHOLHDL 2.1 10/30/2019    He has been working on diet and exercise for glucose, and denies increased appetite, nausea, paresthesia of the feet, polydipsia, polyuria and  visual disturbances. Last A1C in the office was:  Lab Results  Component Value Date   HGBA1C 4.8 10/30/2019   Patient is on Vitamin D supplement, admits hasn't been taking.  Lab Results  Component Value Date   VD25OH 21 (L) 10/30/2019     He has been on finasteride for hair growth; also family history of prostate cancer in father, later in life. Has had several normal screenings. Denies LUTs. Declines check this year after discussion.  Lab Results  Component Value Date   PSA <0.1 10/23/2018   PSA 0.1 08/14/2016   PSA 0.02 08/11/2015     Current Medications:  Current Outpatient Medications on File Prior to Visit  Medication Sig Dispense Refill  . busPIRone (BUSPAR) 10  MG tablet TAKE ONE TABLET BY MOUTH THREE TIMES A DAY AS NEEDED FOR ANXIETY 90 tablet 5  . finasteride (PROSCAR) 5 MG tablet Take 1 tablet (5 mg total) by mouth daily. 90 tablet 3  . tadalafil (CIALIS) 20 MG tablet Take 1 tablet (20 mg total) by mouth daily as needed for erectile dysfunction. 60 tablet 3  . nystatin (MYCOSTATIN/NYSTOP) powder Apply daily after a shower as needed (Patient not taking: No sig reported) 60 g 2  . predniSONE (DELTASONE) 20 MG tablet 2 tablets daily for 3 days, 1 tablet daily for 4 days. 10 tablet 0  . Vitamin D, Ergocalciferol, (DRISDOL) 1.25 MG (50000 UNIT) CAPS capsule 1 pill once a week for vitamin d deficiency (Patient not taking: No sig reported) 8 capsule 1   No current facility-administered medications on file prior to visit.     There is no immunization history on file for this patient.   Health Maintenance:    TD/TDAP: Today Influenza: declines covid 19: 3/3, moderna, 2021- send information  Sexually Active: yes married, declines STD testing  Colonoscopy: due for initial screening, refer for colonoscopy   Last eye: 10/2020, glasses Last dental: 10/2020, goes q101m  Medical History:  Past Medical History:  Diagnosis Date  . Anxiety   . Stricture, urethra    Dr. Risa Romero 2005   Allergies No Known Allergies  SURGICAL HISTORY He  has a past surgical history that includes Urethroplasty (06/2019) and OTHER SURGICAL HISTORY (2008). FAMILY HISTORY His family history includes Cancer (age of onset: 40) in his mother; Cancer (age of onset: 53) in his father; Proteinuria in his father. SOCIAL HISTORY He  reports that he has never smoked. He has never used smokeless tobacco. He reports current alcohol use of about 14.0 standard drinks of alcohol per week. He reports previous drug use. Drug: Marijuana.   Review of Systems: Review of Systems  Constitutional: Negative for chills, diaphoresis, fever, malaise/fatigue and weight loss.  HENT: Negative for  congestion, ear pain and sinus pain.   Eyes: Negative.   Respiratory: Negative.   Cardiovascular: Negative.   Gastrointestinal: Negative.   Genitourinary: Negative for dysuria, flank pain, frequency, hematuria and urgency.  Musculoskeletal: Negative for back pain, falls, joint pain, myalgias and neck pain.  Skin: Negative for rash.  Neurological: Negative.  Negative for weakness.  Endo/Heme/Allergies: Negative.   Psychiatric/Behavioral: Negative for depression, hallucinations, memory loss, substance abuse and suicidal ideas. The patient is nervous/anxious. The patient does not have insomnia.     Physical Exam: Estimated body mass index is 28.07 kg/m as calculated from the following:   Height as of this encounter: 6' (1.829 m).   Weight as of this encounter: 207 lb (93.9 kg). BP 116/68   Pulse  81   Temp 97.7 F (36.5 C)   Ht 6' (1.829 m)   Wt 207 lb (93.9 kg)   SpO2 97%   BMI 28.07 kg/m  General Appearance: Well nourished, developed, good hygiene and dress, in no apparent distress.  Eyes: PERRLA, EOMs, conjunctiva no swelling or erythema, normal fundi and vessels.  Sinuses: + Frontal/maxillary tenderness  ENT/Mouth: Ext aud canals clear, normal light reflex with TMs without erythema or bulging. Good dentition. No erythema, swelling, or exudate on post pharynx. Tonsils not swollen or erythematous. Hearing normal.  Neck: Supple, thyroid normal. No bruits  Respiratory: Respiratory effort normal, BS equal bilaterally without rales, rhonchi, wheezing or stridor.  Cardio: RRR without murmurs, rubs or gallops. Brisk peripheral pulses without edema.  Chest: symmetric, with normal excursions and percussion.  Abdomen: Soft, nontender, no guarding, rebound, hernias, masses, or organomegaly.  Lymphatics: Non tender without lymphadenopathy.  Genitourinary: defer, no concerns Musculoskeletal: Full ROM all peripheral extremities,5/5 strength, and normal gait.  Skin: Warm, dry without rashes,  lesions, ecchymosis. He has several moles 6+ mm to trunk, particularly to left lower back with irregular color and border, extensive smaller dark nevi throughout Neuro: Cranial nerves intact, reflexes equal bilaterally. Normal muscle tone, no cerebellar symptoms. Sensation intact.  Psych: Awake and oriented X 3, normal affect, Insight and Judgment appropriate.    EKG: questionable LVH versus body habitus, get CXR     Willie Romero 1:14 PM [facility]

## 2020-10-30 LAB — COMPLETE METABOLIC PANEL WITH GFR
AG Ratio: 2 (calc) (ref 1.0–2.5)
ALT: 24 U/L (ref 9–46)
AST: 24 U/L (ref 10–40)
Albumin: 4.8 g/dL (ref 3.6–5.1)
Alkaline phosphatase (APISO): 89 U/L (ref 36–130)
BUN: 18 mg/dL (ref 7–25)
CO2: 29 mmol/L (ref 20–32)
Calcium: 9.9 mg/dL (ref 8.6–10.3)
Chloride: 101 mmol/L (ref 98–110)
Creat: 1.32 mg/dL (ref 0.60–1.35)
GFR, Est African American: 75 mL/min/{1.73_m2} (ref >=60)
GFR, Est Non African American: 65 mL/min/{1.73_m2} (ref >=60)
Globulin: 2.4 g/dL (calc) (ref 1.9–3.7)
Glucose, Bld: 84 mg/dL (ref 65–99)
Potassium: 5 mmol/L (ref 3.5–5.3)
Sodium: 138 mmol/L (ref 135–146)
Total Bilirubin: 0.5 mg/dL (ref 0.2–1.2)
Total Protein: 7.2 g/dL (ref 6.1–8.1)

## 2020-10-30 LAB — VITAMIN D 25 HYDROXY (VIT D DEFICIENCY, FRACTURES): Vit D, 25-Hydroxy: 36 ng/mL (ref 30–100)

## 2020-10-30 LAB — CBC WITH DIFFERENTIAL/PLATELET
Absolute Monocytes: 605 cells/uL (ref 200–950)
Basophils Absolute: 72 cells/uL (ref 0–200)
Basophils Relative: 1.1 %
Eosinophils Absolute: 98 cells/uL (ref 15–500)
Eosinophils Relative: 1.5 %
HCT: 46.5 % (ref 38.5–50.0)
Hemoglobin: 16.2 g/dL (ref 13.2–17.1)
Lymphs Abs: 1814 cells/uL (ref 850–3900)
MCH: 30.2 pg (ref 27.0–33.0)
MCHC: 34.8 g/dL (ref 32.0–36.0)
MCV: 86.6 fL (ref 80.0–100.0)
MPV: 10 fL (ref 7.5–12.5)
Monocytes Relative: 9.3 %
Neutro Abs: 3913 cells/uL (ref 1500–7800)
Neutrophils Relative %: 60.2 %
Platelets: 293 10*3/uL (ref 140–400)
RBC: 5.37 10*6/uL (ref 4.20–5.80)
RDW: 12 % (ref 11.0–15.0)
Total Lymphocyte: 27.9 %
WBC: 6.5 10*3/uL (ref 3.8–10.8)

## 2020-10-30 LAB — URINALYSIS, ROUTINE W REFLEX MICROSCOPIC
Bilirubin Urine: NEGATIVE
Glucose, UA: NEGATIVE
Hgb urine dipstick: NEGATIVE
Ketones, ur: NEGATIVE
Leukocytes,Ua: NEGATIVE
Nitrite: NEGATIVE
Protein, ur: NEGATIVE
Specific Gravity, Urine: 1.008 (ref 1.001–1.03)
pH: 6.5 (ref 5.0–8.0)

## 2020-10-30 LAB — TSH: TSH: 1.21 mIU/L (ref 0.40–4.50)

## 2020-10-30 LAB — MAGNESIUM: Magnesium: 2.2 mg/dL (ref 1.5–2.5)

## 2020-12-06 ENCOUNTER — Encounter: Payer: Self-pay | Admitting: Internal Medicine

## 2020-12-09 ENCOUNTER — Other Ambulatory Visit: Payer: Self-pay | Admitting: Adult Health

## 2020-12-09 MED ORDER — AZELASTINE HCL 0.1 % NA SOLN: 2.0000 | Freq: Two times a day (BID) | NASAL | 2 refills | Status: DC

## 2020-12-29 ENCOUNTER — Other Ambulatory Visit: Payer: Self-pay | Admitting: Adult Health

## 2020-12-29 DIAGNOSIS — D225 Melanocytic nevi of trunk: Secondary | ICD-10-CM | POA: Diagnosis not present

## 2020-12-29 DIAGNOSIS — D485 Neoplasm of uncertain behavior of skin: Secondary | ICD-10-CM | POA: Diagnosis not present

## 2020-12-29 DIAGNOSIS — D229 Melanocytic nevi, unspecified: Secondary | ICD-10-CM | POA: Diagnosis not present

## 2021-01-13 DIAGNOSIS — Z20822 Contact with and (suspected) exposure to covid-19: Secondary | ICD-10-CM | POA: Diagnosis not present

## 2021-02-01 ENCOUNTER — Other Ambulatory Visit: Payer: Self-pay

## 2021-02-01 ENCOUNTER — Ambulatory Visit (AMBULATORY_SURGERY_CENTER): Payer: Self-pay | Admitting: *Deleted

## 2021-02-01 VITALS — Ht 73.0 in | Wt 200.0 lb

## 2021-02-01 DIAGNOSIS — Z1211 Encounter for screening for malignant neoplasm of colon: Secondary | ICD-10-CM

## 2021-02-01 MED ORDER — PEG-KCL-NACL-NASULF-NA ASC-C 100 G PO SOLR: 1.0000 | Freq: Once | ORAL | 0 refills | Status: AC

## 2021-02-01 NOTE — Progress Notes (Addendum)
No egg or soy allergy known to patient  No issues with past sedation with any surgeries or procedures Patient denies ever being told they had issues or difficulty with intubation  No FH of Malignant Hyperthermia No diet pills per patient No home 02 use per patient  No blood thinners per patient  Pt denies issues with constipation  No A fib or A flutter  EMMI video to pt or via [facility] 19 guidelines implemented in PV today with Pt and RN     Virtual pre visit completed. Instructions sent through both MyChart and secure email  [email or web address]  Discussed with pt there will be an out-of-pocket cost for prep and that varies from $0 to 70 dollars   Due to the COVID-19 pandemic we are asking patients to follow certain guidelines.  Pt aware of COVID protocols and [facility] guidelines

## 2021-02-15 ENCOUNTER — Encounter: Payer: Self-pay | Admitting: Internal Medicine

## 2021-02-15 ENCOUNTER — Ambulatory Visit (AMBULATORY_SURGERY_CENTER): Payer: BC Managed Care – PPO | Admitting: Internal Medicine

## 2021-02-15 ENCOUNTER — Other Ambulatory Visit: Payer: Self-pay

## 2021-02-15 VITALS — BP 105/50 | HR 65 | Temp 97.7°F | Resp 13 | Ht 73.0 in | Wt 200.0 lb

## 2021-02-15 DIAGNOSIS — D120 Benign neoplasm of cecum: Secondary | ICD-10-CM

## 2021-02-15 DIAGNOSIS — Z1211 Encounter for screening for malignant neoplasm of colon: Secondary | ICD-10-CM | POA: Diagnosis not present

## 2021-02-15 DIAGNOSIS — D122 Benign neoplasm of ascending colon: Secondary | ICD-10-CM | POA: Diagnosis not present

## 2021-02-15 MED ORDER — SODIUM CHLORIDE 0.9 % IV SOLN: 500.0000 mL | INTRAVENOUS | Status: DC

## 2021-02-15 NOTE — Progress Notes (Signed)
Called to room to assist during endoscopic procedure.  Patient ID and intended procedure confirmed with present staff. Received instructions for my participation in the procedure from the performing physician.  

## 2021-02-15 NOTE — Progress Notes (Signed)
pt tolerated well. VSS. awake and to recovery. Report given to RN.  

## 2021-02-15 NOTE — Patient Instructions (Signed)
Handout on polyps given. ° °YOU HAD AN ENDOSCOPIC PROCEDURE TODAY AT THE [facility]:   Refer to the procedure report that was given to you for any specific questions about what was found during the examination.  If the procedure report does not answer your questions, please call your gastroenterologist to clarify.  If you requested that your care partner not be given the details of your procedure findings, then the procedure report has been included in a sealed envelope for you to review at your convenience later. ° °YOU SHOULD EXPECT: Some feelings of bloating in the abdomen. Passage of more gas than usual.  Walking can help get rid of the air that was put into your GI tract during the procedure and reduce the bloating. If you had a lower endoscopy (such as a colonoscopy or flexible sigmoidoscopy) you may notice spotting of blood in your stool or on the toilet paper. If you underwent a bowel prep for your procedure, you may not have a normal bowel movement for a few days. ° °Please Note:  You might notice some irritation and congestion in your nose or some drainage.  This is from the oxygen used during your procedure.  There is no need for concern and it should clear up in a day or so. ° °SYMPTOMS TO REPORT IMMEDIATELY: ° °Following lower endoscopy (colonoscopy or flexible sigmoidoscopy): ° Excessive amounts of blood in the stool ° Significant tenderness or worsening of abdominal pains ° Swelling of the abdomen that is new, acute ° Fever of 100°F or higher ° °For urgent or emergent issues, a gastroenterologist can be reached at any hour by calling [phone]. °Do not use MyChart messaging for urgent concerns.  ° ° °DIET:  We do recommend a small meal at first, but then you may proceed to your regular diet.  Drink plenty of fluids but you should avoid alcoholic beverages for 24 hours. ° °ACTIVITY:  You should plan to take it easy for the rest of today and you should NOT DRIVE or use heavy machinery  until tomorrow (because of the sedation medicines used during the test).   ° °FOLLOW UP: °Our staff will call the number listed on your records 48-72 hours following your procedure to check on you and address any questions or concerns that you may have regarding the information given to you following your procedure. If we do not reach you, we will leave a message.  We will attempt to reach you two times.  During this call, we will ask if you have developed any symptoms of COVID 19. If you develop any symptoms (ie: fever, flu-like symptoms, shortness of breath, cough etc.) before then, please call [phone].  If you test positive for Covid 19 in the 2 weeks post procedure, please call and report this information to us.   ° °If any biopsies were taken you will be contacted by phone or by letter within the next 1-3 weeks.  Please call us at [phone] if you have not heard about the biopsies in 3 weeks.  ° ° °SIGNATURES/CONFIDENTIALITY: °You and/or your care partner have signed paperwork which will be entered into your electronic medical record.  These signatures attest to the fact that that the information above on your After Visit Summary has been reviewed and is understood.  Full responsibility of the confidentiality of this discharge information lies with you and/or your care-partner.  °

## 2021-02-15 NOTE — Op Note (Signed)
[facility] Patient Name: Willie Romero Procedure Date: 02/15/2021 7:37 AM MRN: [id number] Endoscopist: Jerene Bears , MD Age: 45 Referring MD:  Date of Birth: 1976-02-28 Gender: Male Account #: [id number] Procedure:                Colonoscopy Indications:              Screening for colorectal malignant neoplasm, This                            is the patient's first colonoscopy Medicines:                Monitored Anesthesia Care Procedure:                Pre-Anesthesia Assessment:                           - Prior to the procedure, a History and Physical                            was performed, and patient medications and                            allergies were reviewed. The patient's tolerance of                            previous anesthesia was also reviewed. The risks                            and benefits of the procedure and the sedation                            options and risks were discussed with the patient.                            All questions were answered, and informed consent                            was obtained. Prior Anticoagulants: The patient has                            taken no previous anticoagulant or antiplatelet                            agents. ASA Grade Assessment: II - A patient with                            mild systemic disease. After reviewing the risks                            and benefits, the patient was deemed in                            satisfactory condition to undergo the procedure.  After obtaining informed consent, the colonoscope                            was passed under direct vision. Throughout the                            procedure, the patient's blood pressure, pulse, and                            oxygen saturations were monitored continuously. The                            Olympus CF-HQ190L [id number]) Colonoscope was                            introduced through the anus and  advanced to the                            terminal ileum. The colonoscopy was performed                            without difficulty. The patient tolerated the                            procedure well. The quality of the bowel                            preparation was good. The terminal ileum, ileocecal                            valve, appendiceal orifice, and rectum were                            photographed. Scope In: 8:06:45 AM Scope Out: 8:25:24 AM Scope Withdrawal Time: 0 hours 12 minutes 5 seconds  Total Procedure Duration: 0 hours 18 minutes 39 seconds  Findings:                 Skin tags were found on perianal exam.                           The terminal ileum appeared normal.                           A 2 mm polyp was found in the cecum. The polyp was                            sessile. The polyp was removed with a cold snare.                            Resection and retrieval were complete.                           A 3 mm polyp was found in the ascending colon. The  polyp was sessile. The polyp was removed with a                            cold snare. Resection and retrieval were complete.                           The exam was otherwise without abnormality.                           Perianal skin tags and hypertrophied anal papillae.                            No additional abnormalities were found on                            retroflexion. Complications:            No immediate complications. Estimated Blood Loss:     Estimated blood loss: none. Impression:               - Perianal skin tags found on perianal exam.                           - The examined portion of the ileum was normal.                           - One 2 mm polyp in the cecum, removed with a cold                            snare. Resected and retrieved.                           - One 3 mm polyp in the ascending colon, removed                            with a cold snare.  Resected and retrieved.                           - The examination was otherwise normal. Recommendation:           - Patient has a contact number available for                            emergencies. The signs and symptoms of potential                            delayed complications were discussed with the                            patient. Return to normal activities tomorrow.                            Written discharge instructions were provided to the  patient.                           - Resume previous diet.                           - Continue present medications.                           - Await pathology results.                           - Repeat colonoscopy is recommended. The                            colonoscopy date will be determined after pathology                            results from today's exam become available for                            review. Jerene Bears, MD 02/15/2021 8:30:03 AM This report has been signed electronically.

## 2021-02-15 NOTE — Progress Notes (Signed)
VS-CW  Pt's states no medical or surgical changes since previsit or office visit.  

## 2021-02-17 ENCOUNTER — Telehealth: Payer: Self-pay | Admitting: *Deleted

## 2021-02-17 NOTE — Telephone Encounter (Signed)
  Follow up Call-  Call back number 02/15/2021  Post procedure Call Back phone  # [phone]  Permission to leave phone message Yes  Some recent data might be hidden     Patient questions:  Do you have a fever, pain , or abdominal swelling? No. Pain Score  0 *  Have you tolerated food without any problems? Yes.    Have you been able to return to your normal activities? Yes.    Do you have any questions about your discharge instructions: Diet   No. Medications  No. Follow up visit  No.  Do you have questions or concerns about your Care? No.  Actions: * If pain score is 4 or above: No action needed, pain <4.  Have you developed a fever since your procedure? no  2.   Have you had an respiratory symptoms (SOB or cough) since your procedure? no  3.   Have you tested positive for COVID 19 since your procedure no  4.   Have you had any family members/close contacts diagnosed with the COVID 19 since your procedure?  no   If yes to any of these questions please route to Joylene John, RN and Joella Prince, RN

## 2021-02-20 ENCOUNTER — Encounter: Payer: Self-pay | Admitting: Internal Medicine

## 2021-05-25 NOTE — Progress Notes (Signed)
    Future Appointments  Date Time Provider [place]  05/26/2021 10:30 AM Unk Pinto, MD [facility] None  10/31/2021 10:00 AM [name], NP [facility] None    History of Present Illness:      Patient presents with concerns of finding a lump of his lower abdomen.      Patient had a Colon in June per Dr Hilarie Fredrickson & found a couple of tiny polyps.  Medications   Current Outpatient Medications (Cardiovascular):    tadalafil (CIALIS) 20 MG tablet, Take 1 tablet (20 mg total) by mouth daily as needed for erectile dysfunction.  Current Outpatient Medications (Respiratory):    azelastine (ASTELIN) 0.1 % nasal spray, Place 2 sprays into both nostrils 2 (two) times daily. Use in each nostril as directed    Current Outpatient Medications (Other):    busPIRone (BUSPAR) 10 MG tablet, TAKE ONE TABLET BY MOUTH THREE TIMES A DAY AS NEEDED FOR ANXIETY   finasteride (PROSCAR) 5 MG tablet, Take 1 tablet (5 mg total) by mouth daily.  Problem list He has Anxiety; Multiple atypical nevi; and Erectile dysfunction on their problem list.   Observations/Objective:  BP 128/78   Pulse 68   Temp 97.8 F (36.6 C)   Resp 16   Ht 6' (1.829 m)   Wt 181 lb 6.4 oz (82.3 kg)   SpO2 97%   BMI 24.60 kg/m   HEENT - WNL. Neck - supple.  Chest - Clear equal BS. Cor - Nl HS. RRR w/o sig MGR. PP 1(+). No edema. \Abd - There is an ~ 1  cm lipoma of the lower right abdominal wall . MS- FROM w/o deformities.  Gait Nl. Neuro -  Nl w/o focal abnormalities.   Assessment and Plan:  1. Lipoma of abdominal wall  - reassured &  advised if changes to reschedule    Follow Up Instructions:        I discussed the assessment and treatment plan with the patient. The patient was provided an opportunity to ask questions and all were answered. The patient agreed with the plan and demonstrated an understanding of the instructions.       The patient was advised to call back or seek an in-person  evaluation if the symptoms worsen or if the condition fails to improve as anticipated.   Kirtland Bouchard, MD

## 2021-05-26 ENCOUNTER — Ambulatory Visit (INDEPENDENT_AMBULATORY_CARE_PROVIDER_SITE_OTHER): Payer: BC Managed Care – PPO | Admitting: Internal Medicine

## 2021-05-26 ENCOUNTER — Other Ambulatory Visit: Payer: Self-pay

## 2021-05-26 VITALS — BP 128/78 | HR 68 | Temp 97.8°F | Resp 16 | Ht 72.0 in | Wt 181.4 lb

## 2021-05-26 DIAGNOSIS — D171 Benign lipomatous neoplasm of skin and subcutaneous tissue of trunk: Secondary | ICD-10-CM | POA: Diagnosis not present

## 2021-05-29 ENCOUNTER — Encounter: Payer: Self-pay | Admitting: Internal Medicine

## 2021-09-22 ENCOUNTER — Other Ambulatory Visit: Payer: Self-pay | Admitting: Physician Assistant

## 2021-09-26 ENCOUNTER — Other Ambulatory Visit: Payer: Self-pay | Admitting: Internal Medicine

## 2021-09-26 MED ORDER — FINASTERIDE 5 MG PO TABS: ORAL_TABLET | ORAL | 3 refills | Status: DC

## 2021-10-31 ENCOUNTER — Encounter: Payer: BC Managed Care – PPO | Admitting: Adult Health

## 2021-11-01 DIAGNOSIS — Z86010 Personal history of colonic polyps: Secondary | ICD-10-CM | POA: Insufficient documentation

## 2021-11-01 NOTE — Progress Notes (Signed)
Complete Physical  Assessment and Plan:  Encounter for Annual Physical Exam with abnormal findings Due annually  Health Maintenance reviewed Healthy lifestyle reviewed and goals set  Screening cholesterol level -    Has been normal, defer this year  Screening diabetes       -     Very low A1C, good lifestyle - defer       -     CMP for blood sugar  Screening for blood or protein in urine -     Urinalysis, Routine w reflex microscopic  Medication management -     CBC with Differential/Platelet -     COMPLETE METABOLIC PANEL WITH GFR -     Magnesium  Vitamin D deficiency -     VITAMIN D 25 Hydroxy (Vit-D Deficiency, Fractures)  Generalized anxiety disorder -     busPIRone (BUSPAR) 5 MG tablet; Take 1 tablet (5 mg total) by mouth 3 (three) times daily as needed (anxiety). -  has reduced alcohol intake - work on caffeine reduction -     TSH  BMI 25 Long discussion about weight loss, diet, and exercise Recommended diet heavy in fruits and veggies and low in animal meats, cheeses, and dairy products, appropriate calorie intake Try hand held fat % monitor Monitor Labs have been well controlled  History of adenomatous colon polyp 7 year recall due 2029 per Dr. Hilarie Romero High fiber, low red/processed meat   Abnormal EKG/LVH Suggestive of LVH, progressive since 2018 Low BP, unclear etiology Very active, normal exam, denies sx However progressive persistent, will refer to cardiology for ECHO/structural evaluation   Need for tetanus booster - Tdap administered without complication.    Orders Placed This Encounter  Procedures   Tdap vaccine greater than or equal to 7yo IM   CBC with Differential/Platelet   COMPLETE METABOLIC PANEL WITH GFR   Magnesium   VITAMIN D 25 Hydroxy (Vit-D Deficiency, Fractures)   Microalbumin / creatinine urine ratio   Urinalysis, Routine w reflex microscopic   Ambulatory referral to Cardiology   EKG 12-Lead     Discussed med's effects and  SE's. Screening labs and tests as requested with regular follow-up as recommended. Over 40 minutes of exam, counseling, chart review and critical decision making was performed  Future Appointments  Date Time Provider [place]  11/03/2022 10:00 AM Willie Comber, NP [facility] None     HPI  This very nice 46 y.o.male presents for complete physical.  Patient is generally healthy. He has Anxiety; Erectile dysfunction; History of adenomatous polyp of colon; and LVH (left ventricular hypertrophy) on their problem list.   His first wife passed with ovarian cancer, grown daughter from first marriage. He is remarried, no more kids (doesn't want any). He has new job, now in [employer], better from last job.   He has reported prone to anxiety, will worry and obsessively check his home for safety prior to leaving. Feels work related, worse during the week, does manage with buspar and adjusts dose as needed. Have discussed counseling but hasn't been able to schedule.   He was referred to derm last year, several atypical removed, all benign.   He had colonoscopy by Dr. Hilarie Romero 02/15/2021 with 2 polyps, adenomatous, recommended 7 year recall.   BMI is Body mass index is 25.7 kg/m., he has been working on diet and exercise. Reports 1 soda in AM, will do 1 energy drink, also pre- workout (500 + mcg caffeine/day estimated).  Otherwise generally healthy diet. Reports has  reduced alcohol to occasional intake.  Wt Readings from Last 3 Encounters:  11/02/21 194 lb 12.8 oz (88.4 kg)  05/26/21 181 lb 6.4 oz (82.3 kg)  02/15/21 200 lb (90.7 kg)   Today their BP is BP: 106/70  He does workout. He denies chest pain, shortness of breath, dizziness.   He does not have hx of cholesterol problems. His cholesterol is at goal. The cholesterol last visit was:   Lab Results  Component Value Date   CHOL 171 10/30/2019   HDL 81 10/30/2019   LDLCALC 77 10/30/2019   TRIG 52 10/30/2019   CHOLHDL 2.1  10/30/2019    He has been working on diet and exercise for glucose, and denies increased appetite, nausea, paresthesia of the feet, polydipsia, polyuria and visual disturbances. Last A1C in the office was:  Lab Results  Component Value Date   HGBA1C 4.8 10/30/2019   Patient is on Vitamin D supplement, taking daily supplement with K2.  Lab Results  Component Value Date   VD25OH 36 10/29/2020     He has been on finasteride for hair growth; also family history of prostate cancer in father, later in life. Has had several normal screenings. Denies LUTs. Declines check this year after discussion.  Lab Results  Component Value Date   PSA <0.1 10/23/2018   PSA 0.1 08/14/2016   PSA 0.02 08/11/2015     Current Medications:  Current Outpatient Medications on File Prior to Visit  Medication Sig Dispense Refill   aspirin EC 81 MG tablet Take 81 mg by mouth daily. Swallow whole.     Black Pepper-Turmeric (TURMERIC COMPLEX/BLACK PEPPER PO) Take 1 tablet by mouth daily.     busPIRone (BUSPAR) 10 MG tablet TAKE ONE TABLET BY MOUTH THREE TIMES A DAY AS NEEDED FOR ANXIETY 90 tablet 5   Omega-3 Fatty Acids (FISH OIL) 1000 MG CAPS Take 1 tablet by mouth daily.     tadalafil (CIALIS) 20 MG tablet Take 1 tablet (20 mg total) by mouth daily as needed for erectile dysfunction. 60 tablet 3   Vitamin D-Vitamin K (VITAMIN K2-VITAMIN D3 PO) Take 1 tablet by mouth daily. 5000 IU/90 mcg     No current facility-administered medications on file prior to visit.    Immunization History  Administered Date(s) Administered   Tdap 11/02/2021     Health Maintenance  Topic Date Due   COVID-19 Vaccine (1) Never done   INFLUENZA VACCINE  12/02/2021 (Originally 04/04/2021)   COLONOSCOPY (Pts 45-75yrs Insurance coverage will need to be confirmed)  02/16/2028   TETANUS/TDAP  11/03/2031   Hepatitis C Screening  Completed   HPV VACCINES  Aged Out   HIV Screening  Discontinued   Health Maintenance  Topic Date Due    COVID-19 Vaccine (1) Never done   INFLUENZA VACCINE  12/02/2021 (Originally 04/04/2021)   COLONOSCOPY (Pts 45-67yrs Insurance coverage will need to be confirmed)  02/16/2028   TETANUS/TDAP  11/03/2031   Hepatitis C Screening  Completed   HPV VACCINES  Aged Out   HIV Screening  Discontinued    TD/TDAP: Today Influenza: declines covid 19: 3/3, moderna, 2021- send information  Sexually Active: yes married, declines STD testing  Colonoscopy: 02/2021, 2 polyps, 7 year recall per Dr. Hilarie Romero  Last eye: 2022, glasses Last dental: 2022, goes q5m Last derm: 2022, benign biopsies per derm  Medical History:  Past Medical History:  Diagnosis Date   Allergy    Anxiety    Stricture, urethra  Dr. Risa Grill 2005   Allergies No Known Allergies  SURGICAL HISTORY He  has a past surgical history that includes Urethroplasty (06/2019) and OTHER SURGICAL HISTORY (2008). FAMILY HISTORY His family history includes Cancer (age of onset: 23) in his mother; Cancer (age of onset: 48) in his father; Proteinuria in his father. SOCIAL HISTORY He  reports that he has never smoked. He has never used smokeless tobacco. He reports that he does not currently use alcohol. He reports that he does not currently use drugs after having used the following drugs: Marijuana.   Review of Systems: Review of Systems  Constitutional:  Negative for malaise/fatigue and weight loss.  HENT:  Negative for hearing loss and tinnitus.   Eyes:  Negative for blurred vision and double vision.  Respiratory:  Negative for cough, sputum production, shortness of breath and wheezing.   Cardiovascular:  Negative for chest pain, palpitations, orthopnea, claudication, leg swelling and PND.  Gastrointestinal:  Negative for abdominal pain, blood in stool, constipation, diarrhea, heartburn, melena, nausea and vomiting.  Genitourinary: Negative.   Musculoskeletal:  Negative for falls, joint pain and myalgias.  Skin:  Negative for rash.   Neurological:  Negative for dizziness, tingling, sensory change, weakness and headaches.  Endo/Heme/Allergies:  Negative for polydipsia.  Psychiatric/Behavioral: Negative.  Negative for depression, memory loss, substance abuse and suicidal ideas. The patient is not nervous/anxious and does not have insomnia.   All other systems reviewed and are negative.  Physical Exam: Estimated body mass index is 25.7 kg/m as calculated from the following:   Height as of this encounter: 6\' 1"  (1.854 m).   Weight as of this encounter: 194 lb 12.8 oz (88.4 kg). BP 106/70    Pulse (!) 56    Temp 97.9 F (36.6 C)    Ht 6\' 1"  (1.854 m)    Wt 194 lb 12.8 oz (88.4 kg)    SpO2 99%    BMI 25.70 kg/m  General Appearance: Well nourished, developed, good hygiene and dress, in no apparent distress.  Eyes: PERRLA, EOMs, conjunctiva no swelling or erythema Sinuses: + Frontal/maxillary tenderness  ENT/Mouth: Ext aud canals clear, normal light reflex with TMs without erythema or bulging. Good dentition. No erythema, swelling, or exudate on post pharynx. Tonsils not swollen or erythematous. Hearing normal.  Neck: Supple, thyroid normal. No bruits  Respiratory: Respiratory effort normal, BS equal bilaterally without rales, rhonchi, wheezing or stridor.  Cardio: RRR without murmurs, rubs or gallops. Brisk peripheral pulses without edema.  Chest: symmetric, with normal excursions and percussion.  Abdomen: Soft, nontender, no guarding, rebound, hernias, masses, or organomegaly.  Lymphatics: Non tender without lymphadenopathy.  Genitourinary: defer, doing self exams, no concerns Musculoskeletal: Full ROM all peripheral extremities,5/5 strength, and normal gait.  Skin: Warm, dry without rashes, lesions, ecchymosis. He has extensive benign appearing nevi of torso.  Neuro: Cranial nerves intact, reflexes equal bilaterally. Normal muscle tone, no cerebellar symptoms. Sensation intact.  Psych: Awake and oriented X 3, normal  affect, Insight and Judgment appropriate.   EKG: Sinus bradycardia, LVH    Willie Ribas, Willie Romero 1:29 PM [facility]

## 2021-11-02 ENCOUNTER — Encounter: Payer: Self-pay | Admitting: Adult Health

## 2021-11-02 ENCOUNTER — Other Ambulatory Visit: Payer: Self-pay

## 2021-11-02 ENCOUNTER — Ambulatory Visit (INDEPENDENT_AMBULATORY_CARE_PROVIDER_SITE_OTHER): Payer: BC Managed Care – PPO | Admitting: Adult Health

## 2021-11-02 VITALS — BP 106/70 | HR 56 | Temp 97.9°F | Ht 73.0 in | Wt 194.8 lb

## 2021-11-02 DIAGNOSIS — Z136 Encounter for screening for cardiovascular disorders: Secondary | ICD-10-CM | POA: Diagnosis not present

## 2021-11-02 DIAGNOSIS — Z1389 Encounter for screening for other disorder: Secondary | ICD-10-CM

## 2021-11-02 DIAGNOSIS — Z6824 Body mass index (BMI) 24.0-24.9, adult: Secondary | ICD-10-CM

## 2021-11-02 DIAGNOSIS — Z79899 Other long term (current) drug therapy: Secondary | ICD-10-CM

## 2021-11-02 DIAGNOSIS — I10 Essential (primary) hypertension: Secondary | ICD-10-CM

## 2021-11-02 DIAGNOSIS — E559 Vitamin D deficiency, unspecified: Secondary | ICD-10-CM

## 2021-11-02 DIAGNOSIS — Z6825 Body mass index (BMI) 25.0-25.9, adult: Secondary | ICD-10-CM

## 2021-11-02 DIAGNOSIS — Z13220 Encounter for screening for lipoid disorders: Secondary | ICD-10-CM

## 2021-11-02 DIAGNOSIS — F419 Anxiety disorder, unspecified: Secondary | ICD-10-CM

## 2021-11-02 DIAGNOSIS — Z860101 Personal history of adenomatous and serrated colon polyps: Secondary | ICD-10-CM

## 2021-11-02 DIAGNOSIS — Z0001 Encounter for general adult medical examination with abnormal findings: Secondary | ICD-10-CM

## 2021-11-02 DIAGNOSIS — Z86010 Personal history of colonic polyps: Secondary | ICD-10-CM

## 2021-11-02 DIAGNOSIS — N529 Male erectile dysfunction, unspecified: Secondary | ICD-10-CM

## 2021-11-02 DIAGNOSIS — I517 Cardiomegaly: Secondary | ICD-10-CM | POA: Insufficient documentation

## 2021-11-02 DIAGNOSIS — Z23 Encounter for immunization: Secondary | ICD-10-CM | POA: Diagnosis not present

## 2021-11-02 DIAGNOSIS — Z0000 Encounter for general adult medical examination without abnormal findings: Secondary | ICD-10-CM

## 2021-11-02 DIAGNOSIS — D229 Melanocytic nevi, unspecified: Secondary | ICD-10-CM

## 2021-11-02 DIAGNOSIS — Z789 Other specified health status: Secondary | ICD-10-CM

## 2021-11-02 MED ORDER — AZELASTINE HCL 0.1 % NA SOLN: 2.0000 | Freq: Two times a day (BID) | NASAL | 2 refills | Status: DC

## 2021-11-02 MED ORDER — FINASTERIDE 5 MG PO TABS: ORAL_TABLET | ORAL | 3 refills | Status: DC

## 2021-11-02 NOTE — Patient Instructions (Addendum)
Willie Romero , Thank you for taking time to come for your Annual Wellness Visit. I appreciate your ongoing commitment to your health goals. Please review the following plan we discussed and let me know if I can assist you in the future.   These are the goals we discussed:  Goals      Reduce caffeine intake        This is a list of the screening recommended for you and due dates:  Health Maintenance  Topic Date Due   COVID-19 Vaccine (1) Never done   Tetanus Vaccine  Never done   Flu Shot  12/02/2021*   Colon Cancer Screening  02/16/2028   Hepatitis C Screening: USPSTF Recommendation to screen - Ages 18-79 yo.  Completed   HPV Vaccine  Aged Out   HIV Screening  Discontinued  *Topic was postponed. The date shown is not the original due date.    Know what a healthy weight is for you (roughly BMI <25) and aim to maintain this  Aim for 7+ servings of fruits and vegetables daily  65-80+ fluid ounces of water or unsweet tea for healthy kidneys  Limit to max 1 drink of alcohol per day; avoid smoking/tobacco  Limit animal fats in diet for cholesterol and heart health - choose grass fed whenever available  Avoid highly processed foods, and foods high in saturated/trans fats  Aim for low stress - take time to unwind and care for your mental health  Aim for 150 min of moderate intensity exercise weekly for heart health, and weights twice weekly for bone health  Aim for 7-9 hours of sleep daily     A great goal to work towards is aiming to get in a serving daily of some of the most nutritionally dense foods - G- BOMBS daily           High-Fiber Eating Plan Fiber, also called dietary fiber, is a type of carbohydrate. It is found foods such as fruits, vegetables, whole grains, and beans. A high-fiber diet can have many health benefits. Your health care provider may recommend a high-fiber diet to help: Prevent constipation. Fiber can make your bowel movements more  regular. Lower your cholesterol. Relieve the following conditions: Inflammation of veins in the anus (hemorrhoids). Inflammation of specific areas of the digestive tract (uncomplicated diverticulosis). A problem of the large intestine, also called the colon, that sometimes causes pain and diarrhea (irritable bowel syndrome, or IBS). Prevent overeating as part of a weight-loss plan. Prevent heart disease, type 2 diabetes, and certain cancers. What are tips for following this plan? Reading food labels  Check the nutrition facts label on food products for the amount of dietary fiber. Choose foods that have 5 grams of fiber or more per serving. The goals for recommended daily fiber intake include: Men (age 74 or younger): 34-38 g. Men (over age 71): 28-34 g. Women (age 35 or younger): 25-28 g. Women (over age 45): 22-25 g. Your daily fiber goal is _____________ g. Shopping Choose whole fruits and vegetables instead of processed forms, such as apple juice or applesauce. Choose a wide variety of high-fiber foods such as avocados, lentils, oats, and kidney beans. Read the nutrition facts label of the foods you choose. Be aware of foods with added fiber. These foods often have high sugar and sodium amounts per serving. Cooking Use whole-grain flour for baking and cooking. Cook with brown rice instead of white rice. Meal planning Start the day with a breakfast that  is high in fiber, such as a cereal that contains 5 g of fiber or more per serving. Eat breads and cereals that are made with whole-grain flour instead of refined flour or white flour. Eat brown rice, bulgur wheat, or millet instead of white rice. Use beans in place of meat in soups, salads, and pasta dishes. Be sure that half of the grains you eat each day are whole grains. General information You can get the recommended daily intake of dietary fiber by: Eating a variety of fruits, vegetables, grains, nuts, and beans. Taking a  fiber supplement if you are not able to take in enough fiber in your diet. It is better to get fiber through food than from a supplement. Gradually increase how much fiber you consume. If you increase your intake of dietary fiber too quickly, you may have bloating, cramping, or gas. Drink plenty of water to help you digest fiber. Choose high-fiber snacks, such as berries, raw vegetables, nuts, and popcorn. What foods should I eat? Fruits Berries. Pears. Apples. Oranges. Avocado. Prunes and raisins. Dried figs. Vegetables Sweet potatoes. Spinach. Kale. Artichokes. Cabbage. Broccoli. Cauliflower. Green peas. Carrots. Squash. Grains Whole-grain breads. Multigrain cereal. Oats and oatmeal. Brown rice. Barley. Bulgur wheat. [place]. Quinoa. Bran muffins. Popcorn. Rye wafer crackers. Meats and other proteins Navy beans, kidney beans, and pinto beans. Soybeans. Split peas. Lentils. Nuts and seeds. Dairy Fiber-fortified yogurt. Beverages Fiber-fortified soy milk. Fiber-fortified orange juice. Other foods Fiber bars. The items listed above may not be a complete list of recommended foods and beverages. Contact a dietitian for more information. What foods should I avoid? Fruits Fruit juice. Cooked, strained fruit. Vegetables Fried potatoes. Canned vegetables. Well-cooked vegetables. Grains White bread. Pasta made with refined flour. White rice. Meats and other proteins Fatty cuts of meat. Fried chicken or fried fish. Dairy Milk. Yogurt. Cream cheese. Sour cream. Fats and oils Butters. Beverages Soft drinks. Other foods Cakes and pastries. The items listed above may not be a complete list of foods and beverages to avoid. Talk with your dietitian about what choices are best for you. Summary Fiber is a type of carbohydrate. It is found in foods such as fruits, vegetables, whole grains, and beans. A high-fiber diet has many benefits. It can help to prevent constipation, lower blood  cholesterol, aid weight loss, and reduce your risk of heart disease, diabetes, and certain cancers. Increase your intake of fiber gradually. Increasing fiber too quickly may cause cramping, bloating, and gas. Drink plenty of water while you increase the amount of fiber you consume. The best sources of fiber include whole fruits and vegetables, whole grains, nuts, seeds, and beans. This information is not intended to replace advice given to you by your health care provider. Make sure you discuss any questions you have with your health care provider. Document Revised: 12/25/2019 Document Reviewed: 12/25/2019 Elsevier Patient Education  [place].         High-Fiber Eating Plan Fiber, also called dietary fiber, is a type of carbohydrate. It is found foods such as fruits, vegetables, whole grains, and beans. A high-fiber diet can have many health benefits. Your health care provider may recommend a high-fiber diet to help: Prevent constipation. Fiber can make your bowel movements more regular. Lower your cholesterol. Relieve the following conditions: Inflammation of veins in the anus (hemorrhoids). Inflammation of specific areas of the digestive tract (uncomplicated diverticulosis). A problem of the large intestine, also called the colon, that sometimes causes pain and diarrhea (irritable bowel  syndrome, or IBS). Prevent overeating as part of a weight-loss plan. Prevent heart disease, type 2 diabetes, and certain cancers. What are tips for following this plan? Reading food labels  Check the nutrition facts label on food products for the amount of dietary fiber. Choose foods that have 5 grams of fiber or more per serving. The goals for recommended daily fiber intake include: Men (age 30 or younger): 34-38 g. Men (over age 75): 28-34 g. Women (age 16 or younger): 25-28 g. Women (over age 55): 22-25 g. Your daily fiber goal is _____________ g. Shopping Choose whole fruits and  vegetables instead of processed forms, such as apple juice or applesauce. Choose a wide variety of high-fiber foods such as avocados, lentils, oats, and kidney beans. Read the nutrition facts label of the foods you choose. Be aware of foods with added fiber. These foods often have high sugar and sodium amounts per serving. Cooking Use whole-grain flour for baking and cooking. Cook with brown rice instead of white rice. Meal planning Start the day with a breakfast that is high in fiber, such as a cereal that contains 5 g of fiber or more per serving. Eat breads and cereals that are made with whole-grain flour instead of refined flour or white flour. Eat brown rice, bulgur wheat, or millet instead of white rice. Use beans in place of meat in soups, salads, and pasta dishes. Be sure that half of the grains you eat each day are whole grains. General information You can get the recommended daily intake of dietary fiber by: Eating a variety of fruits, vegetables, grains, nuts, and beans. Taking a fiber supplement if you are not able to take in enough fiber in your diet. It is better to get fiber through food than from a supplement. Gradually increase how much fiber you consume. If you increase your intake of dietary fiber too quickly, you may have bloating, cramping, or gas. Drink plenty of water to help you digest fiber. Choose high-fiber snacks, such as berries, raw vegetables, nuts, and popcorn. What foods should I eat? Fruits Berries. Pears. Apples. Oranges. Avocado. Prunes and raisins. Dried figs. Vegetables Sweet potatoes. Spinach. Kale. Artichokes. Cabbage. Broccoli. Cauliflower. Green peas. Carrots. Squash. Grains Whole-grain breads. Multigrain cereal. Oats and oatmeal. Brown rice. Barley. Bulgur wheat. [place]. Quinoa. Bran muffins. Popcorn. Rye wafer crackers. Meats and other proteins Navy beans, kidney beans, and pinto beans. Soybeans. Split peas. Lentils. Nuts and  seeds. Dairy Fiber-fortified yogurt. Beverages Fiber-fortified soy milk. Fiber-fortified orange juice. Other foods Fiber bars. The items listed above may not be a complete list of recommended foods and beverages. Contact a dietitian for more information. What foods should I avoid? Fruits Fruit juice. Cooked, strained fruit. Vegetables Fried potatoes. Canned vegetables. Well-cooked vegetables. Grains White bread. Pasta made with refined flour. White rice. Meats and other proteins Fatty cuts of meat. Fried chicken or fried fish. Dairy Milk. Yogurt. Cream cheese. Sour cream. Fats and oils Butters. Beverages Soft drinks. Other foods Cakes and pastries. The items listed above may not be a complete list of foods and beverages to avoid. Talk with your dietitian about what choices are best for you. Summary Fiber is a type of carbohydrate. It is found in foods such as fruits, vegetables, whole grains, and beans. A high-fiber diet has many benefits. It can help to prevent constipation, lower blood cholesterol, aid weight loss, and reduce your risk of heart disease, diabetes, and certain cancers. Increase your intake of fiber gradually. Increasing fiber too  quickly may cause cramping, bloating, and gas. Drink plenty of water while you increase the amount of fiber you consume. The best sources of fiber include whole fruits and vegetables, whole grains, nuts, seeds, and beans. This information is not intended to replace advice given to you by your health care provider. Make sure you discuss any questions you have with your health care provider. Document Revised: 12/25/2019 Document Reviewed: 12/25/2019 Elsevier Patient Education  2022 [employer].

## 2021-11-03 ENCOUNTER — Other Ambulatory Visit: Payer: Self-pay | Admitting: Adult Health

## 2021-11-03 ENCOUNTER — Encounter: Payer: Self-pay | Admitting: Adult Health

## 2021-11-03 DIAGNOSIS — Z8349 Family history of other endocrine, nutritional and metabolic diseases: Secondary | ICD-10-CM | POA: Insufficient documentation

## 2021-11-03 DIAGNOSIS — N289 Disorder of kidney and ureter, unspecified: Secondary | ICD-10-CM

## 2021-11-03 LAB — CBC WITH DIFFERENTIAL/PLATELET
Absolute Monocytes: 675 cells/uL (ref 200–950)
Basophils Absolute: 72 cells/uL (ref 0–200)
Basophils Relative: 0.8 %
Eosinophils Absolute: 99 cells/uL (ref 15–500)
Eosinophils Relative: 1.1 %
HCT: 46 % (ref 38.5–50.0)
Hemoglobin: 15.4 g/dL (ref 13.2–17.1)
Lymphs Abs: 1971 cells/uL (ref 850–3900)
MCH: 29.6 pg (ref 27.0–33.0)
MCHC: 33.5 g/dL (ref 32.0–36.0)
MCV: 88.5 fL (ref 80.0–100.0)
MPV: 10.5 fL (ref 7.5–12.5)
Monocytes Relative: 7.5 %
Neutro Abs: 6183 cells/uL (ref 1500–7800)
Neutrophils Relative %: 68.7 %
Platelets: 279 10*3/uL (ref 140–400)
RBC: 5.2 10*6/uL (ref 4.20–5.80)
RDW: 11.8 % (ref 11.0–15.0)
Total Lymphocyte: 21.9 %
WBC: 9 10*3/uL (ref 3.8–10.8)

## 2021-11-03 LAB — COMPLETE METABOLIC PANEL WITH GFR
AG Ratio: 2.1 (calc) (ref 1.0–2.5)
ALT: 26 U/L (ref 9–46)
AST: 35 U/L (ref 10–40)
Albumin: 4.7 g/dL (ref 3.6–5.1)
Alkaline phosphatase (APISO): 78 U/L (ref 36–130)
BUN/Creatinine Ratio: 17 (calc) (ref 6–22)
BUN: 27 mg/dL — ABNORMAL HIGH (ref 7–25)
CO2: 29 mmol/L (ref 20–32)
Calcium: 9.8 mg/dL (ref 8.6–10.3)
Chloride: 102 mmol/L (ref 98–110)
Creat: 1.63 mg/dL — ABNORMAL HIGH (ref 0.60–1.29)
Globulin: 2.2 g/dL (calc) (ref 1.9–3.7)
Glucose, Bld: 90 mg/dL (ref 65–99)
Potassium: 4.7 mmol/L (ref 3.5–5.3)
Sodium: 140 mmol/L (ref 135–146)
Total Bilirubin: 0.5 mg/dL (ref 0.2–1.2)
Total Protein: 6.9 g/dL (ref 6.1–8.1)
eGFR: 52 mL/min/{1.73_m2} — ABNORMAL LOW (ref >=60)

## 2021-11-03 LAB — URINALYSIS, ROUTINE W REFLEX MICROSCOPIC
Bilirubin Urine: NEGATIVE
Glucose, UA: NEGATIVE
Hgb urine dipstick: NEGATIVE
Ketones, ur: NEGATIVE
Leukocytes,Ua: NEGATIVE
Nitrite: NEGATIVE
Protein, ur: NEGATIVE
Specific Gravity, Urine: 1.016 (ref 1.001–1.035)
pH: 6 (ref 5.0–8.0)

## 2021-11-03 LAB — MAGNESIUM: Magnesium: 2.3 mg/dL (ref 1.5–2.5)

## 2021-11-03 LAB — MICROALBUMIN / CREATININE URINE RATIO
Creatinine, Urine: 72 mg/dL (ref 20–320)
Microalb Creat Ratio: 3 mcg/mg creat (ref <30)
Microalb, Ur: 0.2 mg/dL

## 2021-11-03 LAB — VITAMIN D 25 HYDROXY (VIT D DEFICIENCY, FRACTURES): Vit D, 25-Hydroxy: 77 ng/mL (ref 30–100)

## 2021-11-04 NOTE — Addendum Note (Signed)
Addended by: Izora Ribas on: 11/04/2021 07:37 AM ? ? Modules accepted: Orders ? ?

## 2021-11-28 ENCOUNTER — Ambulatory Visit: Payer: BC Managed Care – PPO | Admitting: Internal Medicine

## 2021-11-29 NOTE — Progress Notes (Signed)
?Cardiology Office Note:   ? ?Date:  11/30/2021  ? ?ID:  Willie Romero, DOB 01-27-1976, MRN [id number] ? ?PCP:  Unk Pinto, MD ?  ?[facility] Providers ?Cardiologist:  None    ? ?Referring MD: [name], NP  ? ?CC: LVH ?Consulted for the evaluation of LVH at the behest of Unk Pinto, MD ? ?History of Present Illness:   ? ?Willie Romero is a 46 y.o. male with a hx of LVH by EKG and AKI (creatine 1.6 at 11/02/21 assessment).  Seen 11/30/21. ? ?Patient notes that he is feeling great. ?He works out every day. ?He takes protein supplementation and thinks this is the reason his lab testing has been abnormal.  He has no associated albuminuria ?Has had no issues at [facility] and works out daily.  He notes that he is 10% body fat. ? ? Has had no chest pain, chest pressure, chest tightness, chest stinging  No shortness of breath, DOE .  No PND or orthopnea.  No weight gain, leg swelling , or abdominal swelling.  No syncope or near syncope . Notes  no palpitations or funny heart beats.   Has a spike his his heart rate with wood chopping exercise today. ?Nocturnal heart rates in 40s. ?Family History [place] Cardiac: Amyloidosis in father ? ?Patient reports prior cardiac testing including  ? ?Past Medical History:  ?Diagnosis Date  ? Allergy   ? Anxiety   ? Stricture, urethra   ? Dr. Risa Grill 2005  ? ? ?Past Surgical History:  ?Procedure Laterality Date  ? OTHER SURGICAL HISTORY  2008  ? Urethral dilation, Dr. Risa Grill  ? URETHROPLASTY  06/2019  ? Dr. Louis Meckel  ? ? ?Current Medications: ?Current Meds  ?Medication Sig  ? aspirin EC 81 MG tablet Take 81 mg by mouth daily. Swallow whole.  ? azelastine (ASTELIN) 0.1 % nasal spray Place 2 sprays into both nostrils 2 (two) times daily. Use in each nostril as directed  ? Black Pepper-Turmeric (TURMERIC COMPLEX/BLACK PEPPER PO) Take 1 tablet by mouth daily.  ? busPIRone (BUSPAR) 10 MG tablet TAKE ONE TABLET BY MOUTH THREE TIMES A DAY AS NEEDED FOR ANXIETY  ? finasteride (PROSCAR)  5 MG tablet Take 1 tablet  Daily  for Prostate  ? Omega-3 Fatty Acids (FISH OIL) 1000 MG CAPS Take 1 tablet by mouth daily.  ? tadalafil (CIALIS) 20 MG tablet Take 1 tablet (20 mg total) by mouth daily as needed for erectile dysfunction.  ? Vitamin D-Vitamin K (VITAMIN K2-VITAMIN D3 PO) Take 1 tablet by mouth daily. 5000 IU/90 mcg  ?  ? ?Allergies:   Patient has no known allergies.  ? ?Social History  ? ?Socioeconomic History  ? Marital status: Single  ?  Spouse name: Not on file  ? Number of children: Not on file  ? Years of education: Not on file  ? Highest education level: Not on file  ?Occupational History  ? Not on file  ?Tobacco Use  ? Smoking status: Never  ? Smokeless tobacco: Never  ?Vaping Use  ? Vaping Use: Never used  ?Substance and Sexual Activity  ? Alcohol use: Not Currently  ?  Alcohol/week: 0.0 - 3.0 standard drinks  ?  Comment: social on occasion  ? Drug use: Not Currently  ?  Types: Marijuana  ?  Comment: rare  ? Sexual activity: Yes  ?  Partners: Female  ?  Birth control/protection: I.U.D.  ?Other Topics Concern  ? Not on file  ?Social History Narrative  ?  Not on file  ? ?Social Determinants of Health  ? ?Financial Resource Strain: Not on file  ?Food Insecurity: Not on file  ?Transportation Needs: Not on file  ?Physical Activity: Not on file  ?Stress: Not on file  ?Social Connections: Not on file  ?  ? ?Family History: ?The patient's family history includes Cancer (age of onset: 83) in his mother; Cancer (age of onset: 5) in his father; Proteinuria in his father. There is no history of Colon polyps, Colon cancer, Stomach cancer, or Rectal cancer. ? ?ROS:   ?Please see the history of present illness.    ? All other systems reviewed and are negative. ? ?EKGs/Labs/Other Studies Reviewed:   ? ?The following studies were reviewed today: ? ?EKG:  EKG is  ordered today.  The ekg ordered today demonstrates  ?11/24/21:  Sinus bradycardia 54  ? ?Recent Labs: ?11/02/2021: ALT 26; BUN 27; Creat 1.63;  Hemoglobin 15.4; Magnesium 2.3; Platelets 279; Potassium 4.7; Sodium 140  ?Recent Lipid Panel ?   ?Component Value Date/Time  ? CHOL 171 10/30/2019 1109  ? TRIG 52 10/30/2019 1109  ? HDL 81 10/30/2019 1109  ? CHOLHDL 2.1 10/30/2019 1109  ? VLDL 19 08/14/2016 1553  ? [place] 77 10/30/2019 1109  ? ?    ? ?Physical Exam:   ? ?VS:  BP 136/73   Pulse 73   Ht '6\' 1"'$  (1.854 m)   Wt 194 lb (88 kg)   SpO2 99%   BMI 25.60 kg/m?    ? ?Wt Readings from Last 3 Encounters:  ?11/30/21 194 lb (88 kg)  ?11/02/21 194 lb 12.8 oz (88.4 kg)  ?05/26/21 181 lb 6.4 oz (82.3 kg)  ?  ?GEN:  Well nourished, well developed in no acute distress ?HEENT: Normal ?NECK: No JVD; No carotid bruits ?LYMPHATICS: No lymphadenopathy ?CARDIAC: RRR, soft systolic murmur with standing, no rubs, gallops ?RESPIRATORY:  Clear to auscultation without rales, wheezing or rhonchi  ?ABDOMEN: Soft, non-tender, non-distended ?MUSCULOSKELETAL:  No edema; No deformity  ?SKIN: Warm and dry ?NEUROLOGIC:  Alert and oriented x 3 ?PSYCHIATRIC:  Normal affect  ? ?ASSESSMENT:   ? ?1. LVH (left ventricular hypertrophy)   ? ?PLAN:   ? ?LVH ?Family history of cardiac amyloidosis ?Elevated creatinine ?- will do echo with strain; if concerning features will pursue CMR ?- I suspect that this is in the spectrum of Athletes heart and have review this condition with patient ? ?PRN f/u unless concerning findings ? ?   ? ?   ? ? ?Medication Adjustments/Labs and Tests Ordered: ?Current medicines are reviewed at length with the patient today.  Concerns regarding medicines are outlined above.  ?Orders Placed This Encounter  ?Procedures  ? ECHOCARDIOGRAM COMPLETE  ? ?No orders of the defined types were placed in this encounter. ? ? ?Patient Instructions  ?Medication Instructions:  ?Your physician recommends that you continue on your current medications as directed. Please refer to the Current Medication list given to you today. ? ?*If you need a refill on your cardiac medications before  your next appointment, please call your pharmacy* ? ? ?Lab Work: ?NONE ?If you have labs (blood work) drawn today and your tests are completely normal, you will receive your results only by: ?MyChart Message (if you have MyChart) OR ?A paper copy in the mail ?If you have any lab test that is abnormal or we need to change your treatment, we will call you to review the results. ? ? ?Testing/Procedures: ?Your physician has requested that  you have an echocardiogram. Echocardiography is a painless test that uses sound waves to create images of your heart. It provides your doctor with information about the size and shape of your heart and how well your heart?s chambers and valves are working. This procedure takes approximately one hour. There are no restrictions for this procedure. ? ? ? ?Follow-Up: As needed  ?At [facility], you and your health needs are our priority.  As part of our continuing mission to provide you with exceptional heart care, we have created designated Provider Care Teams.  These Care Teams include your primary Cardiologist (physician) and Advanced Practice Providers (APPs -  Physician Assistants and Nurse Practitioners) who all work together to provide you with the care you need, when you need it. ? ? ?Provider:   ?Rudean Haskell, MD  ? ?  ? ?Signed, ?Werner Lean, MD  ?11/30/2021 2:35 PM    ?[place] ? ?

## 2021-11-30 ENCOUNTER — Encounter: Payer: Self-pay | Admitting: Internal Medicine

## 2021-11-30 ENCOUNTER — Ambulatory Visit (INDEPENDENT_AMBULATORY_CARE_PROVIDER_SITE_OTHER): Payer: BC Managed Care – PPO | Admitting: Internal Medicine

## 2021-11-30 VITALS — BP 136/73 | HR 73 | Ht 73.0 in | Wt 194.0 lb

## 2021-11-30 DIAGNOSIS — I517 Cardiomegaly: Secondary | ICD-10-CM | POA: Diagnosis not present

## 2021-11-30 NOTE — Patient Instructions (Signed)
Medication Instructions:  ?Your physician recommends that you continue on your current medications as directed. Please refer to the Current Medication list given to you today. ? ?*If you need a refill on your cardiac medications before your next appointment, please call your pharmacy* ? ? ?Lab Work: ?NONE ?If you have labs (blood work) drawn today and your tests are completely normal, you will receive your results only by: ?MyChart Message (if you have MyChart) OR ?A paper copy in the mail ?If you have any lab test that is abnormal or we need to change your treatment, we will call you to review the results. ? ? ?Testing/Procedures: ?Your physician has requested that you have an echocardiogram. Echocardiography is a painless test that uses sound waves to create images of your heart. It provides your doctor with information about the size and shape of your heart and how well your heart?s chambers and valves are working. This procedure takes approximately one hour. There are no restrictions for this procedure. ? ? ? ?Follow-Up: As needed  ?At [facility], you and your health needs are our priority.  As part of our continuing mission to provide you with exceptional heart care, we have created designated Provider Care Teams.  These Care Teams include your primary Cardiologist (physician) and Advanced Practice Providers (APPs -  Physician Assistants and Nurse Practitioners) who all work together to provide you with the care you need, when you need it. ? ? ?Provider:   ?Rudean Haskell, MD  ? ? ?

## 2021-12-02 ENCOUNTER — Other Ambulatory Visit: Payer: Self-pay | Admitting: Adult Health

## 2021-12-05 ENCOUNTER — Other Ambulatory Visit: Payer: BC Managed Care – PPO

## 2021-12-08 ENCOUNTER — Ambulatory Visit (HOSPITAL_COMMUNITY): Payer: BC Managed Care – PPO | Attending: Cardiovascular Disease

## 2021-12-08 ENCOUNTER — Encounter: Payer: Self-pay | Admitting: Internal Medicine

## 2021-12-08 DIAGNOSIS — I517 Cardiomegaly: Secondary | ICD-10-CM | POA: Insufficient documentation

## 2021-12-08 LAB — ECHOCARDIOGRAM COMPLETE
Area-P 1/2: 4.15 cm2
S' Lateral: 2.6 cm

## 2022-01-18 ENCOUNTER — Encounter: Payer: Self-pay | Admitting: Adult Health

## 2022-01-18 ENCOUNTER — Ambulatory Visit (INDEPENDENT_AMBULATORY_CARE_PROVIDER_SITE_OTHER): Payer: BC Managed Care – PPO | Admitting: Adult Health

## 2022-01-18 VITALS — BP 138/84 | HR 74 | Temp 97.5°F | Wt 194.0 lb

## 2022-01-18 DIAGNOSIS — J0100 Acute maxillary sinusitis, unspecified: Secondary | ICD-10-CM | POA: Diagnosis not present

## 2022-01-18 MED ORDER — AMOXICILLIN-POT CLAVULANATE 875-125 MG PO TABS: 1.0000 | ORAL_TABLET | Freq: Two times a day (BID) | ORAL | 0 refills | Status: AC

## 2022-01-18 NOTE — Progress Notes (Signed)
Assessment and Plan: ? ?Adelaido was seen today for acute visit. ? ?Diagnoses and all orders for this visit: ? ?Acute non-recurrent maxillary sinusitis ?Early sinus vs cannot r/o cracked tooth may not be contributing ?Will start extended course of augmentin considering either etiology and risk of adverse outcome due to upcoming travel out of the country ?Follow up if not improving in 2-3 days after starting abx ?Suggested symptomatic OTC remedies. ?Nasal saline spray for congestion. ?Nasal steroids, allergy pill ?Follow up as needed. ?-     amoxicillin-clavulanate (AUGMENTIN) 875-125 MG tablet; Take 1 tablet by mouth 2 (two) times daily for 10 days. ? ?Discussed med's effects and SE's.   ?Over 15 minutes of exam, counseling, chart review, and critical decision making was performed.  ? ?Future Appointments  ?Date Time Provider [place]  ?01/18/2022  1:45 PM [name], NP [facility] None  ?11/03/2022 10:00 AM [name], NP [facility] None  ? ? ?------------------------------------------------------------------------------------------------------------------ ? ? ?HPI ?BP (!) 148/84   Pulse 74   Temp (!) 97.5 ?F (36.4 ?C)   Wt 194 lb (88 kg)   SpO2 95%   BMI 25.60 kg/m?  ?46 y.o.male presents for evaluation of sinus congestion/tenderness and HA. He just had covid 19 in March 2023.  ? ?He reports 3-4 days of nasal congestion, sinus tenderness, headache without fever/chills. Denies sore throat, cough. He reports taking allegra daily, year round allergies, also taking sudafed intermittently, nasal irrigations, astalin nasal spray. Also concerned due to known cracked R upper molar root, pending implant with dentist but leaving for [place] vacation next week and concerned.  ? ? ?Past Medical History:  ?Diagnosis Date  ? Allergy   ? Anxiety   ? Stricture, urethra   ? Dr. Risa Grill 2005  ?  ? ?No Known Allergies ? ?Current Outpatient Medications on File Prior to Visit  ?Medication Sig  ? aspirin EC 81 MG tablet  Take 81 mg by mouth daily. Swallow whole.  ? azelastine (ASTELIN) 0.1 % nasal spray Place 2 sprays into both nostrils 2 (two) times daily. Use in each nostril as directed  ? Black Pepper-Turmeric (TURMERIC COMPLEX/BLACK PEPPER PO) Take 1 tablet by mouth daily.  ? busPIRone (BUSPAR) 10 MG tablet TAKE ONE TABLET BY MOUTH THREE TIMES A DAY AS NEEDED FOR ANXIETY  ? finasteride (PROSCAR) 5 MG tablet Take 1 tablet  Daily  for Prostate  ? Omega-3 Fatty Acids (FISH OIL) 1000 MG CAPS Take 1 tablet by mouth daily.  ? Vitamin D-Vitamin K (VITAMIN K2-VITAMIN D3 PO) Take 1 tablet by mouth daily. 5000 IU/90 mcg  ? tadalafil (CIALIS) 20 MG tablet Take 1 tablet (20 mg total) by mouth daily as needed for erectile dysfunction. (Patient not taking: Reported on 01/18/2022)  ? ?No current facility-administered medications on file prior to visit.  ? ? ?ROS: all negative except above.  ? ?Physical Exam: ? ?BP (!) 148/84   Pulse 74   Temp (!) 97.5 ?F (36.4 ?C)   Wt 194 lb (88 kg)   SpO2 95%   BMI 25.60 kg/m?  ? ?General Appearance: Well nourished, in no apparent distress. ?Eyes: PERRLA, EOMs, conjunctiva no swelling or erythema ?Sinuses: + Frontal/maxillary tenderness ?ENT/Mouth: Ext aud canals clear, TMs without erythema, bulging. No erythema, swelling, or exudate on post pharynx.  Tonsils not swollen or erythematous. Hearing normal. R upper molar with gold cap, no visible abcess or localized swelling ?Neck: Supple ?Respiratory: Respiratory effort normal, BS equal bilaterally without rales, rhonchi, wheezing or stridor.  ?Cardio:  RRR with no MRGs. Brisk peripheral pulses without edema.  ?Abdomen: Soft, + BS.  Non tender, no guarding, rebound, hernias, masses. ?Lymphatics: Non tender without lymphadenopathy.  ?Musculoskeletal: normal gait.  ?Skin: Warm, dry without rashes, lesions, ecchymosis.  ?Neuro: Cranial nerves intact. Normal muscle tone ?Psych: Awake and oriented X 3, normal affect, Insight and Judgment appropriate.  ?   ? ?Izora Ribas, NP ?1:42 PM ?[facility] ? ?

## 2022-02-07 ENCOUNTER — Encounter: Payer: Self-pay | Admitting: Adult Health

## 2022-02-21 ENCOUNTER — Other Ambulatory Visit: Payer: Self-pay | Admitting: Adult Health

## 2022-02-21 DIAGNOSIS — F419 Anxiety disorder, unspecified: Secondary | ICD-10-CM

## 2022-02-21 MED ORDER — ESCITALOPRAM OXALATE 10 MG PO TABS: 10.0000 mg | ORAL_TABLET | Freq: Every day | ORAL | 0 refills | Status: DC

## 2022-04-21 ENCOUNTER — Ambulatory Visit: Payer: BLUE CROSS/BLUE SHIELD | Admitting: Nurse Practitioner

## 2022-05-24 DIAGNOSIS — F411 Generalized anxiety disorder: Secondary | ICD-10-CM | POA: Diagnosis not present

## 2022-05-31 ENCOUNTER — Ambulatory Visit (INDEPENDENT_AMBULATORY_CARE_PROVIDER_SITE_OTHER): Payer: BLUE CROSS/BLUE SHIELD | Admitting: Nurse Practitioner

## 2022-05-31 ENCOUNTER — Encounter: Payer: Self-pay | Admitting: Nurse Practitioner

## 2022-05-31 VITALS — BP 104/72 | HR 59 | Temp 97.7°F | Ht 72.0 in | Wt 189.0 lb

## 2022-05-31 DIAGNOSIS — Z13220 Encounter for screening for lipoid disorders: Secondary | ICD-10-CM | POA: Diagnosis not present

## 2022-05-31 DIAGNOSIS — Z0001 Encounter for general adult medical examination with abnormal findings: Secondary | ICD-10-CM

## 2022-05-31 DIAGNOSIS — Z0000 Encounter for general adult medical examination without abnormal findings: Secondary | ICD-10-CM

## 2022-05-31 DIAGNOSIS — F419 Anxiety disorder, unspecified: Secondary | ICD-10-CM

## 2022-05-31 DIAGNOSIS — Z1389 Encounter for screening for other disorder: Secondary | ICD-10-CM

## 2022-05-31 DIAGNOSIS — R9431 Abnormal electrocardiogram [ECG] [EKG]: Secondary | ICD-10-CM

## 2022-05-31 DIAGNOSIS — N182 Chronic kidney disease, stage 2 (mild): Secondary | ICD-10-CM

## 2022-05-31 DIAGNOSIS — Z86010 Personal history of colonic polyps: Secondary | ICD-10-CM

## 2022-05-31 DIAGNOSIS — Z79899 Other long term (current) drug therapy: Secondary | ICD-10-CM

## 2022-05-31 DIAGNOSIS — Z6825 Body mass index (BMI) 25.0-25.9, adult: Secondary | ICD-10-CM

## 2022-05-31 DIAGNOSIS — Z131 Encounter for screening for diabetes mellitus: Secondary | ICD-10-CM

## 2022-05-31 NOTE — Patient Instructions (Signed)
Acute Kidney Injury, Adult Acute kidney injury is a sudden worsening of kidney function. The kidneys are a pair of organs that do many important jobs in the body, including: Make urine. Make hormones. Keep the right amount of fluids and chemicals in the body. This condition ranges from mild to severe. Over time, it may develop into long-lasting (chronic) kidney disease. Finding it and treating it early may keep it from becoming a long-lasting disease. What are the causes? Common causes of this condition include: A problem with blood flow to the kidneys. This may be caused by: Low blood pressure or shock. Blood loss. Heart and blood vessel disease. Severe burns. Liver disease. Direct damage to the kidneys. This may be caused by: Certain medicines. A kidney infection. Poisoning. Being around or in contact with toxic substances. A wound from surgery. A hard, direct hit to the kidney area. A sudden block in urine flow. This may be caused by: Cancer. Kidney stones. An enlarged prostate. What increases the risk? Being older than age 65. Being male. Being in the hospital. This is especially true if you are very sick. Having certain conditions, such as: Long-lasting kidney or liver disease. Diabetes. Heart disease and heart failure. Lung disease. What are the signs or symptoms? This condition may not cause symptoms until it becomes severe. Symptoms can include: Feeling very tired or having trouble staying awake. Nausea or vomiting. Swelling (edema) of the face, legs, ankles, or feet. Pain in the belly or pain along the side of your stomach (flank). Urine changes, such as: Making little or no urine. Passing urine with a weak flow. Muscle twitches and cramps, most often in the legs. Confusion or trouble concentrating. Not feeling the urge to eat. Fever. How is this diagnosed? This condition may be diagnosed based on: Your symptoms. Your medical history. A physical  exam. You may have other tests, such as: Blood tests. Urine tests. Imaging tests. A kidney biopsy. This involves removing a sample of kidney tissue to be looked at under a microscope. How is this treated? Treatment depends on the cause and how severe the condition is. In mild cases, treatment may not be needed. The kidneys may heal on their own. In severe cases, treatment may include: Treating the cause of the kidney injury. This may mean that you have to change your medicines or the doses you take. Getting fluids through an IV tube. Having a small, thin tube (catheter) put in. This tube will drain urine and prevent blockages. Trying to keep problems from starting. This may mean not using certain medicines or not having tests done that could cause more kidney injury. In some cases, these treatments are also needed: Dialysis or continuous renal replacement therapy (CRRT). This treatment uses a machine to do the job of the kidneys. Surgery. This may be done to repair a damaged kidney. It could also be done to remove a blockage in the urinary tract. Follow these instructions at home: Medicines Take over-the-counter and prescription medicines only as told by your health care provider. Do not take any new medicines unless approved by your health care provider. Many medicines can make kidney damage worse. Do not take any vitamin or mineral supplements unless approved by your health care provider. Some of these can make kidney damage worse. Lifestyle  Make changes to your diet as told by your health care provider. You may need to eat less protein. Get to, and stay at, a healthy weight. If you need help, ask your health   care provider. Start or keep up an exercise plan. Exercise at least 30 minutes a day, 5 days a week. Do not smoke or use any products that contain nicotine or tobacco. If you need help quitting, ask your health care provider. General instructions  Keep track of your blood  pressure. Tell your health care provider if you notice any changes. Keep your vaccines up to date. Ask your health care provider which vaccines you need. Keep all follow-up visits. Where to find more information American Association of Kidney Patients: www.aakp.org National Kidney Foundation: www.kidney.org American Kidney Fund: www.akfinc.org Medical Education Institute: LifeOptions: www.lifeoptions.org Kidney School: www.kidneyschool.org Contact a health care provider if: Your symptoms get worse. You have new symptoms such as: Headaches. Skin that is darker or lighter than normal. Easy bruising. Itchiness. Hiccups. Lack of menstrual periods. You have a fever. Get help right away if: You have symptoms of worsening kidney disease, such as: Chest pain. Shortness of breath. Seizures. Confusion or trouble thinking. Belly or back pain. You have pain or bleeding when you pass urine. You are making little or no urine. These symptoms may be an emergency. Get help right away. Call 911. Do not wait to see if the symptoms will go away. Do not drive yourself to the hospital. Summary Acute kidney injury is a sudden worsening of kidney function. This condition can be caused by problems with blood flow to the kidneys, damage to the kidneys, or a sudden block in urine flow. This condition may not cause symptoms until it becomes severe. Acute kidney injury can be diagnosed with blood tests, urine tests, imaging tests, and other tests. Treatment depends on the cause and how severe the condition is. This information is not intended to replace advice given to you by your health care provider. Make sure you discuss any questions you have with your health care provider. Document Revised: 11/28/2021 Document Reviewed: 07/01/2019 Elsevier Patient Education  2023 Elsevier Inc.  

## 2022-05-31 NOTE — Addendum Note (Signed)
Addended by: Darrol Jump on: 05/31/2022 10:37 AM   Modules accepted: Level of Service

## 2022-05-31 NOTE — Progress Notes (Signed)
Complete Physical  Assessment and Plan:  Encounter for Annual Physical Exam with abnormal findings Due annually  Health Maintenance reviewed Healthy lifestyle reviewed and goals set  Screening cholesterol level 11/2021 Controlled   Screening diabetes  11/2021 Controlled  Screening for blood or protein in urine Normal UA 11/2021  Medication management All medications discussed and reviewed in full. All questions and concerns regarding medications addressed.     Vitamin D deficiency Continue supplement Monitor levels  Generalized anxiety disorder Continue medications, Buspar  Reviewed relaxation techniques.  Sleep hygiene. Recommended Cognitive Behavioral Therapy (CBT). Recommended mindfulness meditation and exercise.   Encouraged personality growth wand development through coping techniques and problem-solving skills. Limit/Decrease/Monitor drug/alcohol intake.     BMI 25 Discussed appropriate BMI Diet modification. Physical activity. Encouraged/praised to build confidence.  History of adenomatous colon polyp 7 year recall due 2029 per Dr. Hilarie Fredrickson High fiber, low red/processed meat   Abnormal EKG/LVH Suggestive of LVH, progressive since 2018 Completed Echo -  Follows with cardiology  CKD Stage II (GFR 52) Discussed how what you eat and drink can aide in kidney protection. Stay well hydrated. Avoid high salt foods. Avoid NSAIDS. Keep BP and BG well controlled.   Take medications as prescribed. Remain active and exercise as tolerated daily. Maintain weight.  Continue to monitor. Recheck Check CMP/GFR/Microablumin  Medication management All medications discussed and reviewed in full. All questions and concerns regarding medications addressed.    Orders Placed This Encounter  Procedures   COMPLETE METABOLIC PANEL WITH GFR    Notify office for further evaluation and treatment, questions or concerns if any reported s/s fail to improve.   The patient was  advised to call back or seek an in-person evaluation if any symptoms worsen or if the condition fails to improve as anticipated.   Further disposition pending results of labs. Discussed med's effects and SE's.    I discussed the assessment and treatment plan with the patient. The patient was provided an opportunity to ask questions and all were answered. The patient agreed with the plan and demonstrated an understanding of the instructions.  Discussed med's effects and SE's. Screening labs and tests as requested with regular follow-up as recommended.  I provided 30 minutes of face-to-face time during this encounter including counseling, chart review, and critical decision making was preformed.   Future Appointments  Date Time Provider [place]  06/01/2023 10:00 AM Darrol Jump, NP [facility] None     HPI  This very nice 46 y.o.male presents for complete physical.  Patient is generally healthy. He has Anxiety; Erectile dysfunction; History of adenomatous polyp of colon; LVH (left ventricular hypertrophy); Family history of amyloidosis; and Abnormal renal function on their problem list.   His first wife passed with ovarian cancer, grown daughter from first marriage. He is remarried, no more kids (doesn't want any). He has new job, now in [employer], better from last job.   He has reported prone to anxiety, will worry and obsessively check his home for safety prior to leaving. Feels work related, worse during the week, does manage with buspar and adjusts dose as needed. Have discussed counseling but hasn't been able to schedule.   He was referred to derm last year, several atypical removed, all benign.   He had colonoscopy by Dr. Hilarie Fredrickson 02/15/2021 with 2 polyps, adenomatous, recommended 7 year recall.   Saw Cardiology for abd EKG, LVH, work up thought to be "Athlete's Heart."  Completed echo.  He is to follow up PRN.  BMI is Body mass index is 25.63 kg/m., he has been working  on diet and exercise. Reports 1 soda in AM, will do 1 energy drink, also pre- workout (500 + mcg caffeine/day estimated).  Otherwise generally healthy diet. Reports has reduced alcohol to occasional intake.  Wt Readings from Last 3 Encounters:  05/31/22 189 lb (85.7 kg)  01/18/22 194 lb (88 kg)  11/30/21 194 lb (88 kg)   Today their BP is BP: 104/72  He does workout. He denies chest pain, shortness of breath, dizziness.   He does not have hx of cholesterol problems. His cholesterol is at goal. The cholesterol last visit was:   Lab Results  Component Value Date   CHOL 171 10/30/2019   HDL 81 10/30/2019   LDLCALC 77 10/30/2019   TRIG 52 10/30/2019   CHOLHDL 2.1 10/30/2019    He has been working on diet and exercise for glucose, and denies increased appetite, nausea, paresthesia of the feet, polydipsia, polyuria and visual disturbances. Last A1C in the office was:  Lab Results  Component Value Date   HGBA1C 4.8 10/30/2019   Patient is on Vitamin D supplement, taking daily supplement with K2.  Lab Results  Component Value Date   VD25OH 70 11/02/2021     He has been on finasteride for hair growth; also family history of prostate cancer in father, later in life. Has had several normal screenings. Denies LUTs. Declines check this year after discussion.  Lab Results  Component Value Date   PSA <0.1 10/23/2018   PSA 0.1 08/14/2016   PSA 0.02 08/11/2015   He was noted to have CKD stage II.  He tries to stay well hydrated and avoid high salt foods. Last GFR: Lab Results  Component Value Date   EGFR 52 (L) 11/02/2021     Current Medications:  Current Outpatient Medications on File Prior to Visit  Medication Sig Dispense Refill   aspirin EC 81 MG tablet Take 81 mg by mouth daily. Swallow whole.     azelastine (ASTELIN) 0.1 % nasal spray Place 2 sprays into both nostrils 2 (two) times daily. Use in each nostril as directed 30 mL 2   Black Pepper-Turmeric (TURMERIC COMPLEX/BLACK  PEPPER PO) Take 1 tablet by mouth daily.     busPIRone (BUSPAR) 10 MG tablet TAKE ONE TABLET BY MOUTH THREE TIMES A DAY AS NEEDED FOR ANXIETY 90 tablet 5   finasteride (PROSCAR) 5 MG tablet Take 1 tablet  Daily  for Prostate 90 tablet 3   Omega-3 Fatty Acids (FISH OIL) 1000 MG CAPS Take 1 tablet by mouth daily.     tadalafil (CIALIS) 20 MG tablet Take 1 tablet (20 mg total) by mouth daily as needed for erectile dysfunction. 60 tablet 3   Vitamin D-Vitamin K (VITAMIN K2-VITAMIN D3 PO) Take 1 tablet by mouth daily. 5000 IU/90 mcg     escitalopram (LEXAPRO) 10 MG tablet Take 1 tablet (10 mg total) by mouth daily. (Patient not taking: Reported on 05/31/2022) 90 tablet 0   No current facility-administered medications on file prior to visit.    Immunization History  Administered Date(s) Administered   Tdap 11/02/2021     Health Maintenance  Topic Date Due   COVID-19 Vaccine (1) Never done   INFLUENZA VACCINE  Never done   COLONOSCOPY (Pts 45-21yr Insurance coverage will need to be confirmed)  02/16/2028   TETANUS/TDAP  11/03/2031   Hepatitis C Screening  Completed   HPV VACCINES  Aged Out  HIV Screening  Discontinued   Health Maintenance  Topic Date Due   COVID-19 Vaccine (1) Never done   INFLUENZA VACCINE  Never done   COLONOSCOPY (Pts 45-63yr Insurance coverage will need to be confirmed)  02/16/2028   TETANUS/TDAP  11/03/2031   Hepatitis C Screening  Completed   HPV VACCINES  Aged Out   HIV Screening  Discontinued    TD/TDAP: Today Influenza: declines covid 19: 3/3, moderna, 2021- send information  Sexually Active: yes married, declines STD testing  Colonoscopy: 02/2021, 2 polyps, 7 year recall per Dr. PHilarie Fredrickson Last eye: 2022, glasses Last dental: 2022, goes q62mast derm: 2022, benign biopsies per derm  Medical History:  Past Medical History:  Diagnosis Date   Allergy    Anxiety    Stricture, urethra    Dr. GrRisa Grill005   Allergies No Known Allergies  SURGICAL  HISTORY He  has a past surgical history that includes Urethroplasty (06/2019) and OTHER SURGICAL HISTORY (2008). FAMILY HISTORY His family history includes Cancer (age of onset: 5259in his mother; Cancer (age of onset: 6475in his father; Proteinuria in his father. SOCIAL HISTORY He  reports that he has never smoked. He has never used smokeless tobacco. He reports that he does not currently use alcohol. He reports that he does not currently use drugs after having used the following drugs: Marijuana.   Review of Systems: Review of Systems  Constitutional:  Negative for malaise/fatigue and weight loss.  HENT:  Negative for hearing loss and tinnitus.   Eyes:  Negative for blurred vision and double vision.  Respiratory:  Negative for cough, sputum production, shortness of breath and wheezing.   Cardiovascular:  Negative for chest pain, palpitations, orthopnea, claudication, leg swelling and PND.  Gastrointestinal:  Negative for abdominal pain, blood in stool, constipation, diarrhea, heartburn, melena, nausea and vomiting.  Genitourinary: Negative.   Musculoskeletal:  Negative for falls, joint pain and myalgias.  Skin:  Negative for rash.  Neurological:  Negative for dizziness, tingling, sensory change, weakness and headaches.  Endo/Heme/Allergies:  Negative for polydipsia.  Psychiatric/Behavioral: Negative.  Negative for depression, memory loss, substance abuse and suicidal ideas. The patient is not nervous/anxious and does not have insomnia.   All other systems reviewed and are negative.   Physical Exam: Estimated body mass index is 25.63 kg/m as calculated from the following:   Height as of this encounter: 6' (1.829 m).   Weight as of this encounter: 189 lb (85.7 kg). BP 104/72   Pulse (!) 59   Temp 97.7 F (36.5 C)   Ht 6' (1.829 m)   Wt 189 lb (85.7 kg)   SpO2 98%   BMI 25.63 kg/m  General Appearance: Well nourished, developed, good hygiene and dress, in no apparent distress.   Eyes: PERRLA, EOMs, conjunctiva no swelling or erythema Sinuses: + Frontal/maxillary tenderness  ENT/Mouth: Ext aud canals clear, normal light reflex with TMs without erythema or bulging. Good dentition. No erythema, swelling, or exudate on post pharynx. Tonsils not swollen or erythematous. Hearing normal.  Neck: Supple, thyroid normal. No bruits  Respiratory: Respiratory effort normal, BS equal bilaterally without rales, rhonchi, wheezing or stridor.  Cardio: RRR without murmurs, rubs or gallops. Brisk peripheral pulses without edema.  Chest: symmetric, with normal excursions and percussion.  Abdomen: Soft, nontender, no guarding, rebound, hernias, masses, or organomegaly.  Lymphatics: Non tender without lymphadenopathy.  Genitourinary: defer, doing self exams, no concerns Musculoskeletal: Full ROM all peripheral extremities,5/5 strength, and normal gait.  Skin: Warm, dry without rashes, lesions, ecchymosis. He has extensive benign appearing nevi of torso.  Neuro: Cranial nerves intact, reflexes equal bilaterally. Normal muscle tone, no cerebellar symptoms. Sensation intact.  Psych: Awake and oriented X 3, normal affect, Insight and Judgment appropriate.   EKG: Sinus bradycardia, LVH - follows cardiology   Darrol Jump, NP-C 10:19 AM [facility]

## 2022-06-01 LAB — COMPLETE METABOLIC PANEL WITH GFR
AG Ratio: 2 (calc) (ref 1.0–2.5)
ALT: 27 U/L (ref 9–46)
AST: 34 U/L (ref 10–40)
Albumin: 4.7 g/dL (ref 3.6–5.1)
Alkaline phosphatase (APISO): 75 U/L (ref 36–130)
BUN/Creatinine Ratio: 20 (calc) (ref 6–22)
BUN: 29 mg/dL — ABNORMAL HIGH (ref 7–25)
CO2: 28 mmol/L (ref 20–32)
Calcium: 10.1 mg/dL (ref 8.6–10.3)
Chloride: 102 mmol/L (ref 98–110)
Creat: 1.43 mg/dL — ABNORMAL HIGH (ref 0.60–1.29)
Globulin: 2.3 g/dL (calc) (ref 1.9–3.7)
Glucose, Bld: 75 mg/dL (ref 65–99)
Potassium: 4.6 mmol/L (ref 3.5–5.3)
Sodium: 140 mmol/L (ref 135–146)
Total Bilirubin: 0.6 mg/dL (ref 0.2–1.2)
Total Protein: 7 g/dL (ref 6.1–8.1)
eGFR: 61 mL/min/{1.73_m2} (ref >=60)

## 2022-06-06 DIAGNOSIS — F411 Generalized anxiety disorder: Secondary | ICD-10-CM | POA: Diagnosis not present

## 2022-06-20 DIAGNOSIS — F411 Generalized anxiety disorder: Secondary | ICD-10-CM | POA: Diagnosis not present

## 2022-07-05 DIAGNOSIS — F411 Generalized anxiety disorder: Secondary | ICD-10-CM | POA: Diagnosis not present

## 2022-07-11 DIAGNOSIS — F411 Generalized anxiety disorder: Secondary | ICD-10-CM | POA: Diagnosis not present

## 2022-07-20 DIAGNOSIS — F411 Generalized anxiety disorder: Secondary | ICD-10-CM | POA: Diagnosis not present

## 2022-07-25 DIAGNOSIS — F411 Generalized anxiety disorder: Secondary | ICD-10-CM | POA: Diagnosis not present

## 2022-08-03 DIAGNOSIS — F411 Generalized anxiety disorder: Secondary | ICD-10-CM | POA: Diagnosis not present

## 2022-09-12 DIAGNOSIS — F411 Generalized anxiety disorder: Secondary | ICD-10-CM | POA: Diagnosis not present

## 2022-09-20 DIAGNOSIS — F411 Generalized anxiety disorder: Secondary | ICD-10-CM | POA: Diagnosis not present

## 2022-09-28 DIAGNOSIS — F411 Generalized anxiety disorder: Secondary | ICD-10-CM | POA: Diagnosis not present

## 2022-10-05 DIAGNOSIS — F411 Generalized anxiety disorder: Secondary | ICD-10-CM | POA: Diagnosis not present

## 2022-10-12 DIAGNOSIS — F411 Generalized anxiety disorder: Secondary | ICD-10-CM | POA: Diagnosis not present

## 2022-10-26 DIAGNOSIS — F411 Generalized anxiety disorder: Secondary | ICD-10-CM | POA: Diagnosis not present

## 2022-11-03 ENCOUNTER — Encounter: Payer: BLUE CROSS/BLUE SHIELD | Admitting: Nurse Practitioner

## 2022-11-16 DIAGNOSIS — F411 Generalized anxiety disorder: Secondary | ICD-10-CM | POA: Diagnosis not present

## 2022-11-30 DIAGNOSIS — F411 Generalized anxiety disorder: Secondary | ICD-10-CM | POA: Diagnosis not present

## 2022-12-07 DIAGNOSIS — F411 Generalized anxiety disorder: Secondary | ICD-10-CM | POA: Diagnosis not present

## 2022-12-14 DIAGNOSIS — F411 Generalized anxiety disorder: Secondary | ICD-10-CM | POA: Diagnosis not present

## 2022-12-20 ENCOUNTER — Ambulatory Visit: Payer: BLUE CROSS/BLUE SHIELD | Admitting: Nurse Practitioner

## 2022-12-20 ENCOUNTER — Encounter: Payer: Self-pay | Admitting: Nurse Practitioner

## 2022-12-20 VITALS — BP 130/78 | HR 75 | Temp 97.9°F | Resp 16 | Ht 72.0 in | Wt 203.6 lb

## 2022-12-20 DIAGNOSIS — S46212A Strain of muscle, fascia and tendon of other parts of biceps, left arm, initial encounter: Secondary | ICD-10-CM

## 2022-12-20 DIAGNOSIS — M25512 Pain in left shoulder: Secondary | ICD-10-CM | POA: Diagnosis not present

## 2022-12-20 MED ORDER — CYCLOBENZAPRINE HCL 5 MG PO TABS: 5.0000 mg | ORAL_TABLET | Freq: Three times a day (TID) | ORAL | 0 refills | Status: DC | PRN

## 2022-12-20 NOTE — Progress Notes (Signed)
Assessment and Plan:  Willie Romero was seen today for ann episodic visit.  Diagnoses and all order for this visit:  Strain of left biceps, initial encounter Rest area of injury, light massage. Avoid strenuous exercise or working out for 2 weeks May take low dose muscle relaxer for pain relief. Continue to monitor If s/s fail to improve suggest further imaging with MRI  - cyclobenzaprine (FLEXERIL) 5 MG tablet; Take 1 tablet (5 mg total) by mouth 3 (three) times daily as needed for muscle spasms.  Dispense: 15 tablet; Refill: 0  Acute pain of left shoulder Discuss alternating sleeping positions. Continue to monitor   Notify office for further evaluation and treatment, questions or concerns if s/s fail to improve. The risks and benefits of my recommendations, as well as other treatment options were discussed with the patient today. Questions were answered.  Further disposition pending results of labs. Discussed med's effects and SE's.    Over 15 minutes of exam, counseling, chart review, and critical decision making was performed.   Future Appointments  Date Time Provider Department Center  06/01/2023 10:00 AM Tomie Spizzirri, [name], NP [facility] None    ------------------------------------------------------------------------------------------------------------------   HPI BP 130/78   Pulse 75   Temp 97.9 F (36.6 C)   Resp 16   Ht 6' (1.829 m)   Wt 203 lb 9.6 oz (92.4 kg)   SpO2 98%   BMI 27.61 kg/m   46 y.o.male presents for left bicep pain along with left shoulder pain.  Feels as though he is unable to flex left bicep fully, fells weaker than right bicep.  Rates pain 3/10.  He is very active in going to the gym seven days a week and works out with weights/weight lifting. He has not noticed any injury like an indention or bulge to the left bicep. He has not felt a pop to the area.  He does report sleeping with his head resting on the bicep area nightly with arm extended. He does  note playing sports when he was younger with occasional injures to head neck and shoulders.  Does not feel as though these past injuries affect him now.  He has not taken any medication for the pain at this time.   Past Medical History:  Diagnosis Date   Allergy    Anxiety    Stricture, urethra    Dr. Isabel Caprice 2005     No Known Allergies  Current Outpatient Medications on File Prior to Visit  Medication Sig   aspirin EC 81 MG tablet Take 81 mg by mouth daily. Swallow whole.   azelastine (ASTELIN) 0.1 % nasal spray Place 2 sprays into both nostrils 2 (two) times daily. Use in each nostril as directed   Black Pepper-Turmeric (TURMERIC COMPLEX/BLACK PEPPER PO) Take 1 tablet by mouth daily.   busPIRone (BUSPAR) 10 MG tablet TAKE ONE TABLET BY MOUTH THREE TIMES A DAY AS NEEDED FOR ANXIETY   finasteride (PROSCAR) 5 MG tablet Take 1 tablet  Daily  for Prostate   Omega-3 Fatty Acids (FISH OIL) 1000 MG CAPS Take 1 tablet by mouth daily.   tadalafil (CIALIS) 20 MG tablet Take 1 tablet (20 mg total) by mouth daily as needed for erectile dysfunction.   Vitamin D-Vitamin K (VITAMIN K2-VITAMIN D3 PO) Take 1 tablet by mouth daily. 5000 IU/90 mcg   escitalopram (LEXAPRO) 10 MG tablet Take 1 tablet (10 mg total) by mouth daily. (Patient not taking: Reported on 05/31/2022)   No current facility-administered medications on file  prior to visit.    ROS: all negative except what is noted in the HPI.   Physical Exam:  BP 130/78   Pulse 75   Temp 97.9 F (36.6 C)   Resp 16   Ht 6' (1.829 m)   Wt 203 lb 9.6 oz (92.4 kg)   SpO2 98%   BMI 27.61 kg/m   General Appearance: NAD.  Awake, conversant and cooperative. Eyes: PERRLA, EOMs intact.  Sclera white.  Conjunctiva without erythema. Sinuses: No frontal/maxillary tenderness.  No nasal discharge. Nares patent.  ENT/Mouth: Ext aud canals clear.  Bilateral TMs w/DOL and without erythema or bulging. Hearing intact.  Posterior pharynx without swelling or  exudate.  Tonsils without swelling or erythema.  Neck: Supple.  No masses, nodules or thyromegaly. Respiratory: Effort is regular with non-labored breathing. Breath sounds are equal bilaterally without rales, rhonchi, wheezing or stridor.  Cardio: RRR with no MRGs. Brisk peripheral pulses without edema.  Abdomen: Active BS in all four quadrants.  Soft and non-tender without guarding, rebound tenderness, hernias or masses. Lymphatics: Non tender without lymphadenopathy.  Musculoskeletal: Full ROM, 5/5 strength, normal ambulation.  No clubbing or cyanosis.  Tenderness to bicep muscle along lateral side.  No bulge or indention noted.  Skin: Appropriate color for ethnicity. Warm without rashes, lesions, ecchymosis, ulcers.  Neuro: CN II-XII grossly normal. Normal muscle tone without cerebellar symptoms and intact sensation.   Psych: AO X 3,  appropriate mood and affect, insight and judgment.     Adela Glimpse, NP 9:26 AM [facility]

## 2022-12-20 NOTE — Patient Instructions (Signed)
You do not have a bicep tendon tear but you may have a  strain.  Review related information below regarding a bicep tear to avoid further complications.   Distal Biceps Tendon Tear The distal biceps tendon is a strong cord of tissue that connects the biceps muscle to a bone (radius) in the elbow. The biceps muscle is a muscle on the front of the upper arm. A distal biceps tendon tear may include: A complete tear in the tendon, causing the tendon to completely separate from the radius. When this happens, the biceps muscle pulls up toward the shoulder. A partial tear in the tendon that causes the tendon to partially separate from the radius. This condition can affect your ability to turn your hand palm-up and bend your elbow. It is often treated with surgery. Recovery may take 4-8 months. What are the causes? This condition is usually caused by suddenly straightening the elbow while the biceps muscle is tightened (contracted). This could happen while lifting or carrying a heavy object that suddenly falls or shifts position. What increases the risk? The following factors may make you more likely to develop this condition: Being a 10-48 year old male. Smoking. Using steroids. What are the signs or symptoms? Symptoms of this condition may include: A feeling like a snap or a pop in the elbow at the time of injury. Pain, swelling, and bruising in the front of the elbow. Weakness in the arm. A bulge in the upper arm. You may feel this in the front of the arm, the inside of the arm, or both. Limited range of motion of the elbow and forearm. How is this diagnosed? This condition may be diagnosed based on: Your symptoms and medical history. A physical exam. Your health care provider may test your range of motion by having you do arm movements. Imaging tests, such as X-rays, MRI, or ultrasound. How is this treated? Treatment for this condition may include: Medicines to help relieve pain and  inflammation. A splint or brace to immobilize your elbow. A sling to help rest your arm. Resting from sports and intense physical activity. Surgery to reattach your tendon to your radius. This is the most common treatment. Physical therapy. Follow these instructions at home: If you have a removable splint, brace, or sling: Wear the splint, brace, or sling as told by your health care provider. Remove it only as told by your health care provider. Do not put pressure on any part of the splint until it is fully hardened. This may take several hours. Check the skin around the splint, brace, or sling every day. Tell your health care provider about any concerns. Loosen the splint, brace, or sling if your fingers tingle, become numb, or turn cold and blue. Keep the splint, brace, or sling clean and dry. Bathing Do not take baths, swim, or use a hot tub until your health care provider approves. Ask your health care provider if you may take showers. You may only be allowed to take sponge baths. If you have a splint, brace, or sling that is not waterproof: Do not let it get wet. Cover it with a watertight covering when you take a bath or shower. Managing pain, stiffness, and swelling     If directed, put ice on the injured area. To do this: If you have a removable splint, brace, or sling, remove it as told by your health care provider. Put ice in a plastic bag. Place a towel between your skin and the  bag. Leave the ice on for 20 minutes, 2-3 times a day. If directed, apply heat to the affected area before you exercise or as often as told by your health care provider. Use the heat source that your health care provider recommends, such as a moist heat pack or a heating pad. Place a towel between your skin and the heat source. Leave the heat on for 20-30 minutes. If your skin turns bright red, remove the ice or heat right away to prevent skin damage. The risk of skin damage is higher if you cannot  feel pain, heat, or cold. Move your fingers often to avoid stiffness and to lessen swelling. Raise (elevate) the injured area above the level of your heart while you are sitting or lying down. Activity Until your health care provider approves: Do not lift or carry anything with your injured arm. Do not use the affected arm to support your body weight. Do not participate in contact sports. Ask your health care provider when it is safe to drive if you have a splint, brace, or sling on your arm. Do exercises as told by your health care provider. Avoid activities that cause pain or make your condition worse. Return to your normal activities as told by your health care provider. Ask your health care provider what activities are safe for you. General instructions Take over-the-counter and prescription medicines only as told by your health care provider. Do not use any products that contain nicotine or tobacco. These products include cigarettes, chewing tobacco, and vaping devices, such as e-cigarettes. These can delay healing. If you need help quitting, ask your health care provider. Keep all follow-up visits. Your health care provider will check if your injury is healing. Contact a health care provider if: Your splint or brace causes pain or numbness. Get help right away if: You develop severe pain. You develop numbness or tingling in your hand. Your hand feels unusually cold. Your fingernails turn a dark color, such as blue or gray. This information is not intended to replace advice given to you by your health care provider. Make sure you discuss any questions you have with your health care provider. Document Revised: 01/16/2022 Document Reviewed: 01/16/2022 Elsevier Patient Education  2023 [employer].

## 2022-12-21 DIAGNOSIS — F411 Generalized anxiety disorder: Secondary | ICD-10-CM | POA: Diagnosis not present

## 2022-12-28 DIAGNOSIS — F411 Generalized anxiety disorder: Secondary | ICD-10-CM | POA: Diagnosis not present

## 2023-01-11 DIAGNOSIS — F411 Generalized anxiety disorder: Secondary | ICD-10-CM | POA: Diagnosis not present

## 2023-02-01 DIAGNOSIS — F411 Generalized anxiety disorder: Secondary | ICD-10-CM | POA: Diagnosis not present

## 2023-02-08 DIAGNOSIS — F411 Generalized anxiety disorder: Secondary | ICD-10-CM | POA: Diagnosis not present

## 2023-02-14 ENCOUNTER — Encounter: Payer: Self-pay | Admitting: Nurse Practitioner

## 2023-02-14 ENCOUNTER — Ambulatory Visit: Payer: BLUE CROSS/BLUE SHIELD | Admitting: Nurse Practitioner

## 2023-02-14 VITALS — BP 102/80 | HR 60 | Temp 97.7°F | Ht 73.0 in | Wt 202.2 lb

## 2023-02-14 DIAGNOSIS — Z79899 Other long term (current) drug therapy: Secondary | ICD-10-CM | POA: Diagnosis not present

## 2023-02-14 DIAGNOSIS — Z6825 Body mass index (BMI) 25.0-25.9, adult: Secondary | ICD-10-CM

## 2023-02-14 DIAGNOSIS — N182 Chronic kidney disease, stage 2 (mild): Secondary | ICD-10-CM | POA: Diagnosis not present

## 2023-02-14 DIAGNOSIS — Z131 Encounter for screening for diabetes mellitus: Secondary | ICD-10-CM

## 2023-02-14 DIAGNOSIS — E559 Vitamin D deficiency, unspecified: Secondary | ICD-10-CM

## 2023-02-14 DIAGNOSIS — Z1329 Encounter for screening for other suspected endocrine disorder: Secondary | ICD-10-CM

## 2023-02-14 DIAGNOSIS — Z13220 Encounter for screening for lipoid disorders: Secondary | ICD-10-CM | POA: Diagnosis not present

## 2023-02-14 DIAGNOSIS — R9431 Abnormal electrocardiogram [ECG] [EKG]: Secondary | ICD-10-CM | POA: Diagnosis not present

## 2023-02-14 DIAGNOSIS — I517 Cardiomegaly: Secondary | ICD-10-CM

## 2023-02-14 DIAGNOSIS — R5383 Other fatigue: Secondary | ICD-10-CM

## 2023-02-14 DIAGNOSIS — F419 Anxiety disorder, unspecified: Secondary | ICD-10-CM

## 2023-02-14 DIAGNOSIS — E538 Deficiency of other specified B group vitamins: Secondary | ICD-10-CM

## 2023-02-14 DIAGNOSIS — E349 Endocrine disorder, unspecified: Secondary | ICD-10-CM | POA: Diagnosis not present

## 2023-02-14 DIAGNOSIS — Z130 Encounter for screening for diseases of the blood and blood-forming organs and certain disorders involving the immune mechanism: Secondary | ICD-10-CM | POA: Diagnosis not present

## 2023-02-14 DIAGNOSIS — L089 Local infection of the skin and subcutaneous tissue, unspecified: Secondary | ICD-10-CM

## 2023-02-14 DIAGNOSIS — B9689 Other specified bacterial agents as the cause of diseases classified elsewhere: Secondary | ICD-10-CM

## 2023-02-14 DIAGNOSIS — Z86010 Personal history of colonic polyps: Secondary | ICD-10-CM

## 2023-02-14 MED ORDER — AMOXICILLIN-POT CLAVULANATE 500-125 MG PO TABS
1.0000 | ORAL_TABLET | Freq: Two times a day (BID) | ORAL | 0 refills | Status: DC
Start: 2023-02-14 — End: 2023-05-31

## 2023-02-14 NOTE — Patient Instructions (Signed)

## 2023-02-14 NOTE — Progress Notes (Signed)
Follow Up  Willie Romero was seen today for a follow up.  Below references plan of care and diagnostics:  Assessment and Plan:  LVH (left ventricular hypertrophy)/Abnormal EKG Suggestive of LVH, progressive since 2018 Completed Echo - Follows with cardiology Monitor electrolytes  - Magnesium  CKD (chronic kidney disease), stage II Discussed how what you eat and drink can aide in kidney protection. Stay well hydrated. Avoid high salt foods. Avoid NSAIDS. Keep BP and BG well controlled.   Take medications as prescribed. Remain active and exercise as tolerated daily. Maintain weight.  Continue to monitor. Check CMP/GFR/Microablumin  - COMPLETE METABOLIC PANEL WITH GFR  Anxiety Defers medication if possible. Continue weekly therapy/CBT Discussed natural supplements including Ashwaganda and [place] wart. Obtain additional blood work assess for any underlying contributors to increase in anxiety including IDA/low hemoglobin, hypomagnesia, thyroid disease. Monitor B-12 and testosterone for deficiency  - CBC with Differential/Platelet - Magnesium - TSH - Vitamin B12 - Testosterone  Other fatigue Discussed secondary to low testosterone levels Will re-check levels today. Decrease intake of alcohol Aim for adequate sleep   - CBC with Differential/Platelet - Magnesium - Vitamin B12 - Testosterone  Testosterone deficiency Monitor levels Discussed benefit of Zinc supplementation  - Testosterone  Vitamin D deficiency  - VITAMIN D 25 Hydroxy (Vit-D Deficiency, Fractures)  B12 deficiency  - Vitamin B12  BMI 25.0-25.9,adult Discussed appropriate BMI Diet modification. Physical activity. Encouraged/praised to build confidence.  Screening for thyroid disorder  - TSH  Screening for diabetes mellitus  - Hemoglobin A1c - Insulin, random  Screening for cholesterol level  - Lipid panel  Skin infection, bacterial Start Augmentin Continue to monitor for  increase in redness, streaking, pustule drainage, fever, chills, N/V. Contact office if s/s fail to improve.   History of adenomatous polyp of colon 7 year recall due 2029 per Dr. Rhea Belton High fiber, low red/processed meat  Stay well hydrated Remain active.  Medication management All medications discussed and reviewed in full. All questions and concerns regarding medications addressed.    - CBC with Differential/Platelet - COMPLETE METABOLIC PANEL WITH GFR - Magnesium - Lipid panel - TSH - Hemoglobin A1c - Insulin, random - VITAMIN D 25 Hydroxy (Vit-D Deficiency, Fractures) - Vitamin B12 - Testosterone  Meds ordered this encounter  Medications   amoxicillin-clavulanate (AUGMENTIN) 500-125 MG tablet    Sig: Take 1 tablet by mouth in the morning and at bedtime.    Dispense:  10 tablet    Refill:  0    Order Specific Question:   Supervising Provider    Answer:   Lucky Cowboy [phone]    Notify office for further evaluation and treatment, questions or concerns if any reported s/s fail to improve.   The patient was advised to call back or seek an in-person evaluation if any symptoms worsen or if the condition fails to improve as anticipated.   Further disposition pending results of labs. Discussed med's effects and SE's.    I discussed the assessment and treatment plan with the patient. The patient was provided an opportunity to ask questions and all were answered. The patient agreed with the plan and demonstrated an understanding of the instructions.  Discussed med's effects and SE's. Screening labs and tests as requested with regular follow-up as recommended.  I provided 30 minutes of face-to-face time during this encounter including counseling, chart review, and critical decision making was preformed.   Future Appointments  Date Time Provider Department Center  08/13/2023  9:00 AM [name],  [name], NP [facility] None     HPI  This very nice Willie Romero presents for  a general follow up.  Patient is generally healthy. Willie Romero has Anxiety; Erectile dysfunction; History of adenomatous polyp of colon; LVH (left ventricular hypertrophy); Family history of amyloidosis; and Abnormal renal function on their problem list.   Willie Romero passed with ovarian cancer, Willie Romero. Willie Romero is remarried, no more kids (doesn't want any). Willie Romero is excited for a family trip planned to [place] 03/2023.  Willie Romero has reported prone to anxiety, will worry and obsessively check Willie home for safety prior to leaving.  Follows with therapist weekly.  Has tried Lexapro and Buspar however not receptive to treating with medications, feels as though this can be managed with alternative and natural remedies.  Has noticed anxiety is more heightened after drinking a beer/alcohol, does not feel as though Willie Romero rests well at night although Willie Romero admits to getting 10 hours of sleep, does not feel restful, wakes up multiple times, does not wake himself snoring, which could relate to increase in feeling anxious.  Concerned for low testosterone and other vitamin deficiencies.  Has increase in fatigue overall.    Willie Romero is also concerned for a right heel wound that continues to remain erythematous and midly painful. Has treated with OTC topical antibiotic and peroxide.  Does not remember how Willie Romero injured.  Denies fever, chills, N/V, streaking.    Willie Romero was referred to derm last year, several atypical removed, all benign.   Willie Romero had colonoscopy by Dr. Rhea Belton 02/15/2021 with 2 polyps, adenomatous, recommended 7 year recall.   Saw Cardiology for abd EKG, LVH, work up thought to be "Athlete's Heart."  Completed echo.  Willie Romero is to follow up PRN.  Denies chest pain, heart palpitations, dizziness, syncope.  BMI is Body mass index is 26.68 kg/m., Willie Romero has been working on diet and exercise. Works out daily at [employer]   Wt Readings from Last 3 Encounters:  02/14/23 202 lb 3.2 oz (91.7 kg)  12/20/22 203 lb 9.6 oz (92.4 kg)   05/31/22 189 lb (85.7 kg)   Today their BP is BP: 102/80  Willie Romero does workout. Willie Romero denies chest pain, shortness of breath, dizziness.   Willie Romero does not have hx of cholesterol problems. Willie cholesterol is at goal. The cholesterol last visit was:   Lab Results  Component Value Date   CHOL 171 10/30/2019   HDL 81 10/30/2019   LDLCALC 77 10/30/2019   TRIG 52 10/30/2019   CHOLHDL 2.1 10/30/2019    Willie Romero has been working on diet and exercise for glucose, and denies increased appetite, nausea, paresthesia of the feet, polydipsia, polyuria and visual disturbances. Last A1C in the office was:  Lab Results  Component Value Date   HGBA1C 4.8 10/30/2019   Patient is on Vitamin D supplement, taking daily supplement with K2.  Lab Results  Component Value Date   VD25OH 58 11/02/2021     Willie Romero has been on finasteride for hair growth; also family history of prostate cancer in father, later in life. Has had several normal screenings. Denies LUTs. Declines check this year after discussion.  Lab Results  Component Value Date   PSA <0.1 10/23/2018   PSA 0.1 08/14/2016   PSA 0.02 08/11/2015   Willie Romero was noted to have CKD stage II.  Willie Romero tries to stay well hydrated and avoid high salt foods. Does take creatinine and protein supplement along with mushroom supplement and turmeric.  Last  GFR: Lab Results  Component Value Date   EGFR 61 05/31/2022     Current Medications:  Current Outpatient Medications on File Prior to Visit  Medication Sig Dispense Refill   aspirin EC 81 MG tablet Take 81 mg by mouth daily. Swallow whole.     azelastine (ASTELIN) 0.1 % nasal spray Place 2 sprays into both nostrils 2 (two) times daily. Use in each nostril as directed 30 mL 2   Black Pepper-Turmeric (TURMERIC COMPLEX/BLACK PEPPER PO) Take 1 tablet by mouth daily.     finasteride (PROSCAR) 5 MG tablet Take 1 tablet  Daily  for Prostate 90 tablet 3   tadalafil (CIALIS) 20 MG tablet Take 1 tablet (20 mg total) by mouth daily as needed  for erectile dysfunction. 60 tablet 3   Vitamin D-Vitamin K (VITAMIN K2-VITAMIN D3 PO) Take 1 tablet by mouth daily. 5000 IU/90 mcg     busPIRone (BUSPAR) 10 MG tablet TAKE ONE TABLET BY MOUTH THREE TIMES A DAY AS NEEDED FOR ANXIETY (Patient not taking: Reported on 02/14/2023) 90 tablet 5   cyclobenzaprine (FLEXERIL) 5 MG tablet Take 1 tablet (5 mg total) by mouth 3 (three) times daily as needed for muscle spasms. (Patient not taking: Reported on 02/14/2023) 15 tablet 0   escitalopram (LEXAPRO) 10 MG tablet Take 1 tablet (10 mg total) by mouth daily. (Patient not taking: Reported on 05/31/2022) 90 tablet 0   Omega-3 Fatty Acids (FISH OIL) 1000 MG CAPS Take 1 tablet by mouth daily. (Patient not taking: Reported on 02/14/2023)     No current facility-administered medications on file prior to visit.    Immunization History  Administered Date(s) Administered   Tdap 11/02/2021     Health Maintenance  Topic Date Due   COVID-19 Vaccine (1) Never done   INFLUENZA VACCINE  04/05/2023   Colonoscopy  02/16/2028   DTaP/Tdap/Td (2 - Td or Tdap) 11/03/2031   Hepatitis C Screening  Completed   HPV VACCINES  Aged Out   HIV Screening  Discontinued   Health Maintenance  Topic Date Due   COVID-19 Vaccine (1) Never done   INFLUENZA VACCINE  04/05/2023   Colonoscopy  02/16/2028   DTaP/Tdap/Td (2 - Td or Tdap) 11/03/2031   Hepatitis C Screening  Completed   HPV VACCINES  Aged Out   HIV Screening  Discontinued    Medical History:  Past Medical History:  Diagnosis Date   Allergy    Anxiety    Stricture, urethra    Dr. Isabel Caprice 2005   Allergies No Known Allergies  SURGICAL HISTORY Willie Romero  has a past surgical history that includes Urethroplasty (06/2019) and OTHER SURGICAL HISTORY (2008). FAMILY HISTORY Willie family history includes Cancer (age of onset: 30) in Willie mother; Cancer (age of onset: 82) in Willie father; Proteinuria in Willie father. SOCIAL HISTORY Willie Romero  reports that Willie Romero has never smoked. Willie Romero has  never used smokeless tobacco. Willie Romero reports that Willie Romero does not currently use alcohol. Willie Romero reports that Willie Romero does not currently use drugs after having used the following drugs: Marijuana.   Review of Systems: Review of Systems  Constitutional:  Negative for malaise/fatigue and weight loss.  HENT:  Negative for hearing loss and tinnitus.   Eyes:  Negative for blurred vision and double vision.  Respiratory:  Negative for cough, sputum production, shortness of breath and wheezing.   Cardiovascular:  Negative for chest pain, palpitations, orthopnea, claudication, leg swelling and PND.  Gastrointestinal:  Negative for abdominal pain, blood in stool,  constipation, diarrhea, heartburn, melena, nausea and vomiting.  Genitourinary: Negative.   Musculoskeletal:  Negative for falls, joint pain and myalgias.  Skin:  Negative for rash.  Neurological:  Negative for dizziness, tingling, sensory change, weakness and headaches.  Endo/Heme/Allergies:  Negative for polydipsia.  Psychiatric/Behavioral: Negative.  Negative for depression, memory loss, substance abuse and suicidal ideas. The patient is not nervous/anxious and does not have insomnia.   All other systems reviewed and are negative.   Physical Exam: Estimated body mass index is 26.68 kg/m as calculated from the following:   Height as of this encounter: 6\' 1"  (1.854 m).   Weight as of this encounter: 202 lb 3.2 oz (91.7 kg). BP 102/80   Pulse 60   Temp 97.7 F (36.5 C)   Ht 6\' 1"  (1.854 m)   Wt 202 lb 3.2 oz (91.7 kg)   SpO2 99%   BMI 26.68 kg/m  General Appearance: Well nourished, developed, good hygiene and dress, in no apparent distress.  Eyes: PERRLA, EOMs, conjunctiva no swelling or erythema Sinuses: + Frontal/maxillary tenderness  ENT/Mouth: Ext aud canals clear, normal light reflex with TMs without erythema or bulging. Good dentition. No erythema, swelling, or exudate on post pharynx. Tonsils not swollen or erythematous. Hearing normal.   Neck: Supple, thyroid normal. No bruits  Respiratory: Respiratory effort normal, BS equal bilaterally without rales, rhonchi, wheezing or stridor.  Cardio: RRR without murmurs, rubs or gallops. Brisk peripheral pulses without edema.  Chest: symmetric, with normal excursions and percussion.  Abdomen: Soft, nontender, no guarding, rebound, hernias, masses, or organomegaly.  Lymphatics: Non tender without lymphadenopathy.  Genitourinary: defer, doing self exams, no concerns Musculoskeletal: Full ROM all peripheral extremities,5/5 strength, and normal gait.  Skin:  Right heel with approximately 2 cm round flat erythematous area, surrounding by dry and flaky skin.  Tender to palpation. No rashes, lesions, ecchymosis. Willie [facility]ero has extensive benign appearing nevi of torso.  Neuro: Cranial nerves intact, reflexes equal bilaterally. Normal muscle tone, no cerebellar symptoms. Sensation intact.  Psych: Awake and oriented X 3, normal affect, Insight and Judgment appropriate.   Adela Glimpse, NP-C 11:19 AM [facility]

## 2023-02-15 DIAGNOSIS — F411 Generalized anxiety disorder: Secondary | ICD-10-CM | POA: Diagnosis not present

## 2023-02-15 LAB — INSULIN, RANDOM: Insulin: 2.8 u[IU]/mL

## 2023-02-15 LAB — CBC WITH DIFFERENTIAL/PLATELET
Absolute Monocytes: 598 cells/uL (ref 200–950)
Basophils Absolute: 72 cells/uL (ref 0–200)
Basophils Relative: 1 %
Eosinophils Absolute: 130 cells/uL (ref 15–500)
Eosinophils Relative: 1.8 %
HCT: 45 % (ref 38.5–50.0)
Hemoglobin: 15.3 g/dL (ref 13.2–17.1)
Lymphs Abs: 1670 cells/uL (ref 850–3900)
MCH: 29.8 pg (ref 27.0–33.0)
MCHC: 34 g/dL (ref 32.0–36.0)
MCV: 87.7 fL (ref 80.0–100.0)
MPV: 10.3 fL (ref 7.5–12.5)
Monocytes Relative: 8.3 %
Neutro Abs: 4730 cells/uL (ref 1500–7800)
Neutrophils Relative %: 65.7 %
Platelets: 243 10*3/uL (ref 140–400)
RBC: 5.13 10*6/uL (ref 4.20–5.80)
RDW: 12.2 % (ref 11.0–15.0)
Total Lymphocyte: 23.2 %
WBC: 7.2 10*3/uL (ref 3.8–10.8)

## 2023-02-15 LAB — COMPLETE METABOLIC PANEL WITH GFR
AG Ratio: 2 (calc) (ref 1.0–2.5)
ALT: 16 U/L (ref 9–46)
AST: 25 U/L (ref 10–40)
Albumin: 4.6 g/dL (ref 3.6–5.1)
Alkaline phosphatase (APISO): 76 U/L (ref 36–130)
BUN/Creatinine Ratio: 17 (calc) (ref 6–22)
BUN: 26 mg/dL — ABNORMAL HIGH (ref 7–25)
CO2: 29 mmol/L (ref 20–32)
Calcium: 9.6 mg/dL (ref 8.6–10.3)
Chloride: 104 mmol/L (ref 98–110)
Creat: 1.56 mg/dL — ABNORMAL HIGH (ref 0.60–1.29)
Globulin: 2.3 g/dL (calc) (ref 1.9–3.7)
Glucose, Bld: 90 mg/dL (ref 65–99)
Potassium: 5.1 mmol/L (ref 3.5–5.3)
Sodium: 140 mmol/L (ref 135–146)
Total Bilirubin: 0.6 mg/dL (ref 0.2–1.2)
Total Protein: 6.9 g/dL (ref 6.1–8.1)
eGFR: 55 mL/min/{1.73_m2} — ABNORMAL LOW (ref >=60)

## 2023-02-15 LAB — LIPID PANEL
Cholesterol: 151 mg/dL (ref <200)
HDL: 73 mg/dL (ref >=40)
LDL Cholesterol (Calc): 66 mg/dL (calc)
Non-HDL Cholesterol (Calc): 78 mg/dL (calc) (ref <130)
Total CHOL/HDL Ratio: 2.1 (calc) (ref <5.0)
Triglycerides: 42 mg/dL (ref <150)

## 2023-02-15 LAB — MAGNESIUM: Magnesium: 2.2 mg/dL (ref 1.5–2.5)

## 2023-02-15 LAB — VITAMIN B12: Vitamin B-12: 589 pg/mL (ref 200–1100)

## 2023-02-15 LAB — HEMOGLOBIN A1C
Hgb A1c MFr Bld: 5.2 % of total Hgb (ref <5.7)
Mean Plasma Glucose: 103 mg/dL
eAG (mmol/L): 5.7 mmol/L

## 2023-02-15 LAB — TESTOSTERONE: Testosterone: 673 ng/dL (ref 250–827)

## 2023-02-15 LAB — VITAMIN D 25 HYDROXY (VIT D DEFICIENCY, FRACTURES): Vit D, 25-Hydroxy: 55 ng/mL (ref 30–100)

## 2023-02-15 LAB — TSH: TSH: 1.44 mIU/L (ref 0.40–4.50)

## 2023-02-16 ENCOUNTER — Encounter: Payer: Self-pay | Admitting: Nurse Practitioner

## 2023-02-16 DIAGNOSIS — F419 Anxiety disorder, unspecified: Secondary | ICD-10-CM

## 2023-02-19 MED ORDER — HYDROXYZINE PAMOATE 25 MG PO CAPS: 25.0000 mg | ORAL_CAPSULE | Freq: Three times a day (TID) | ORAL | 0 refills | Status: DC | PRN

## 2023-02-21 ENCOUNTER — Other Ambulatory Visit: Payer: Self-pay | Admitting: Nurse Practitioner

## 2023-02-21 ENCOUNTER — Encounter: Payer: BLUE CROSS/BLUE SHIELD | Admitting: Nurse Practitioner

## 2023-02-22 DIAGNOSIS — F411 Generalized anxiety disorder: Secondary | ICD-10-CM | POA: Diagnosis not present

## 2023-03-22 DIAGNOSIS — F411 Generalized anxiety disorder: Secondary | ICD-10-CM | POA: Diagnosis not present

## 2023-03-29 DIAGNOSIS — F411 Generalized anxiety disorder: Secondary | ICD-10-CM | POA: Diagnosis not present

## 2023-04-03 DIAGNOSIS — E063 Autoimmune thyroiditis: Secondary | ICD-10-CM | POA: Diagnosis not present

## 2023-04-03 DIAGNOSIS — Z0001 Encounter for general adult medical examination with abnormal findings: Secondary | ICD-10-CM | POA: Diagnosis not present

## 2023-04-03 DIAGNOSIS — Z8249 Family history of ischemic heart disease and other diseases of the circulatory system: Secondary | ICD-10-CM | POA: Diagnosis not present

## 2023-04-05 DIAGNOSIS — F411 Generalized anxiety disorder: Secondary | ICD-10-CM | POA: Diagnosis not present

## 2023-04-11 DIAGNOSIS — F411 Generalized anxiety disorder: Secondary | ICD-10-CM | POA: Diagnosis not present

## 2023-04-18 DIAGNOSIS — F411 Generalized anxiety disorder: Secondary | ICD-10-CM | POA: Diagnosis not present

## 2023-05-30 ENCOUNTER — Encounter: Payer: BLUE CROSS/BLUE SHIELD | Admitting: Nurse Practitioner

## 2023-05-31 ENCOUNTER — Encounter: Payer: Self-pay | Admitting: Nurse Practitioner

## 2023-05-31 ENCOUNTER — Ambulatory Visit (INDEPENDENT_AMBULATORY_CARE_PROVIDER_SITE_OTHER): Payer: BLUE CROSS/BLUE SHIELD | Admitting: Nurse Practitioner

## 2023-05-31 VITALS — BP 110/80 | HR 63 | Temp 97.8°F | Ht 72.0 in | Wt 207.0 lb

## 2023-05-31 DIAGNOSIS — E559 Vitamin D deficiency, unspecified: Secondary | ICD-10-CM

## 2023-05-31 DIAGNOSIS — N182 Chronic kidney disease, stage 2 (mild): Secondary | ICD-10-CM

## 2023-05-31 DIAGNOSIS — I10 Essential (primary) hypertension: Secondary | ICD-10-CM

## 2023-05-31 DIAGNOSIS — Z6825 Body mass index (BMI) 25.0-25.9, adult: Secondary | ICD-10-CM

## 2023-05-31 DIAGNOSIS — R300 Dysuria: Secondary | ICD-10-CM

## 2023-05-31 DIAGNOSIS — N401 Enlarged prostate with lower urinary tract symptoms: Secondary | ICD-10-CM

## 2023-05-31 DIAGNOSIS — R9431 Abnormal electrocardiogram [ECG] [EKG]: Secondary | ICD-10-CM

## 2023-05-31 DIAGNOSIS — Z13220 Encounter for screening for lipoid disorders: Secondary | ICD-10-CM

## 2023-05-31 DIAGNOSIS — Z79899 Other long term (current) drug therapy: Secondary | ICD-10-CM

## 2023-05-31 DIAGNOSIS — Z125 Encounter for screening for malignant neoplasm of prostate: Secondary | ICD-10-CM | POA: Diagnosis not present

## 2023-05-31 DIAGNOSIS — Z136 Encounter for screening for cardiovascular disorders: Secondary | ICD-10-CM

## 2023-05-31 DIAGNOSIS — Z860101 Personal history of adenomatous and serrated colon polyps: Secondary | ICD-10-CM

## 2023-05-31 DIAGNOSIS — Z0000 Encounter for general adult medical examination without abnormal findings: Secondary | ICD-10-CM | POA: Diagnosis not present

## 2023-05-31 DIAGNOSIS — Z86010 Personal history of colonic polyps: Secondary | ICD-10-CM

## 2023-05-31 DIAGNOSIS — R350 Frequency of micturition: Secondary | ICD-10-CM | POA: Diagnosis not present

## 2023-05-31 DIAGNOSIS — Z0001 Encounter for general adult medical examination with abnormal findings: Secondary | ICD-10-CM

## 2023-05-31 DIAGNOSIS — Z1389 Encounter for screening for other disorder: Secondary | ICD-10-CM | POA: Diagnosis not present

## 2023-05-31 DIAGNOSIS — F411 Generalized anxiety disorder: Secondary | ICD-10-CM

## 2023-05-31 DIAGNOSIS — Z8042 Family history of malignant neoplasm of prostate: Secondary | ICD-10-CM

## 2023-05-31 LAB — CBC WITH DIFFERENTIAL/PLATELET
Absolute Monocytes: 705 cells/uL (ref 200–950)
Basophils Absolute: 49 cells/uL (ref 0–200)
Basophils Relative: 0.6 %
Eosinophils Absolute: 122 cells/uL (ref 15–500)
Eosinophils Relative: 1.5 %
HCT: 45.8 % (ref 38.5–50.0)
Hemoglobin: 15.2 g/dL (ref 13.2–17.1)
Lymphs Abs: 2406 cells/uL (ref 850–3900)
MCH: 29.6 pg (ref 27.0–33.0)
MCHC: 33.2 g/dL (ref 32.0–36.0)
MCV: 89.1 fL (ref 80.0–100.0)
MPV: 10.3 fL (ref 7.5–12.5)
Monocytes Relative: 8.7 %
Neutro Abs: 4820 cells/uL (ref 1500–7800)
Neutrophils Relative %: 59.5 %
Platelets: 299 10*3/uL (ref 140–400)
RBC: 5.14 10*6/uL (ref 4.20–5.80)
RDW: 11.8 % (ref 11.0–15.0)
Total Lymphocyte: 29.7 %
WBC: 8.1 10*3/uL (ref 3.8–10.8)

## 2023-05-31 MED ORDER — CIPROFLOXACIN HCL 250 MG PO TABS: ORAL_TABLET | ORAL | 0 refills | Status: DC

## 2023-05-31 NOTE — Progress Notes (Signed)
Complete Physical  Assessment and Plan:  Encounter for Annual Physical Exam with abnormal findings Due annually  Health Maintenance reviewed Healthy lifestyle reviewed and goals set  Screening for blood or protein in urine/Dysuria UA/Microalbumin with Cx.  Start tmt with Ciprofloxacin. Stay well hydrated to keep urinary system well flushed Consider daily cranberry juice or oral supplement to help any bacteria from adhering to bladder wall causing increase for infection. Monitor for any increase in fever, chills, N/V, abdominal pain, hematuria.   Contact office or report to ER for further evaluation if s/s fail to improve or any sign of worsening infection as noted above.   Vitamin D deficiency Continue supplement Monitor levels  Generalized anxiety disorder Continue medications, Buspar Reviewed relaxation techniques.  Sleep hygiene. Recommended Cognitive Behavioral Therapy (CBT). Recommended mindfulness meditation and exercise.   Encouraged personality growth wand development through coping techniques and problem-solving skills. Limit/Decrease/Monitor drug/alcohol intake.    BMI 25 Discussed appropriate BMI Diet modification. Physical activity. Encouraged/praised to build confidence.  History of adenomatous colon polyp 7 year recall due 2029 per Dr. Rhea Belton High fiber, low red/processed meat   Abnormal EKG/LVH Suggestive of LVH, progressive since 2018 Completed Echo -  Follows with cardiology  CKD Stage II (GFR 52) Discussed how what you eat and drink can aide in kidney protection. Stay well hydrated. Avoid high salt foods. Avoid NSAIDS. Keep BP and BG well controlled.   Take medications as prescribed. Remain active and exercise as tolerated daily. Maintain weight.  Continue to monitor. Recheck Check CMP/GFR/Microablumin  Medication management All medications discussed and reviewed in full. All questions and concerns regarding medications addressed.     Hypomagnesia Monitor levels  Family hx of prostate cancer/Screening for prostate cancer Monitor PSA  Orders Placed This Encounter  Procedures   Urine Culture   CBC with Differential/Platelet   COMPLETE METABOLIC PANEL WITH GFR   Magnesium   PSA   Urinalysis, Routine w reflex microscopic   Microalbumin / creatinine urine ratio   EKG 12-Lead   Meds ordered this encounter  Medications   ciprofloxacin (CIPRO) 250 MG tablet    Sig: Take 1 tablet 2 x /day with Food for Infection    Dispense:  14 tablet    Refill:  0    Order Specific Question:   Supervising Provider    Answer:   Lucky Cowboy [phone]   Notify office for further evaluation and treatment, questions or concerns if any reported s/s fail to improve.   The patient was advised to call back or seek an in-person evaluation if any symptoms worsen or if the condition fails to improve as anticipated.   Further disposition pending results of labs. Discussed med's effects and SE's.    I discussed the assessment and treatment plan with the patient. The patient was provided an opportunity to ask questions and all were answered. The patient agreed with the plan and demonstrated an understanding of the instructions.  Discussed med's effects and SE's. Screening labs and tests as requested with regular follow-up as recommended.  I provided 40 minutes of face-to-face time during this encounter including counseling, chart review, and critical decision making was preformed.  Future Appointments  Date Time Provider Department Center  05/30/2024 10:00 AM Adela Glimpse, NP [facility] None     HPI  This very nice 47 y.o.male presents for complete physical.  Patient is generally healthy. He has Anxiety; Erectile dysfunction; History of adenomatous polyp of colon; LVH (left ventricular hypertrophy); Family history of amyloidosis; and  Abnormal renal function on their problem list.   His first wife passed with ovarian cancer, grown  daughter from first marriage. He is remarried, no more kids (doesn't want any). 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   Has increase in dysuria x the last 3 days.  Felt to be associated with decrease in fluids/water intake.  Does have a hx of urethral stricture.  Notably had workup by [facility] 2020 year for hematuria, had urethral stricture, CT abdomen (no access to report) apparently showed multiple small lucent areas of pelvic bone and underwent NM scan 12/16/2018 which showed symmetrical uptake suggestive of degenrative changes, no evidence of malignancy or metastatic disease.   He has reported prone to anxiety, will worry and obsessively check his home for safety prior to leaving. Feels work related, worse during the week.  Tried Buspar but did not help, SE of grogginess.  Continues weekly counseling.  Saw a holistic specialist that completed lab work and found to have hypomagnesia.  He has started a supplement.  Trying to work on a better sleep pattern.   He was referred to derm last year, several atypical removed, all benign.   He had colonoscopy by Dr. Rhea Belton 02/15/2021 with 2 polyps, adenomatous, recommended 7 year recall.   Saw Cardiology for abd EKG, LVH, work up thought to be "Athlete's Heart."  Completed echo.  He is to follow up PRN.    BMI is Body mass index is 28.07 kg/m., he has been working on diet and exercise. Reports 1 soda in AM, will do 1 energy drink, also pre- workout (500 + mcg caffeine/day estimated).  Otherwise generally healthy diet. Reports has reduced alcohol to occasional intake.  Wt Readings from Last 3 Encounters:  05/31/23 207 lb (93.9 kg)  02/14/23 202 lb 3.2 oz (91.7 kg)  12/20/22 203 lb 9.6 oz (92.4 kg)   Today their BP is BP: 110/80  He does workout. He denies chest pain, shortness of breath, dizziness.   He does not have hx of cholesterol problems. His cholesterol is at goal. The cholesterol last visit was:   Lab Results   Component Value Date   CHOL 151 02/14/2023   HDL 73 02/14/2023   LDLCALC 66 02/14/2023   TRIG 42 02/14/2023   CHOLHDL 2.1 02/14/2023    He has been working on diet and exercise for glucose, and denies increased appetite, nausea, paresthesia of the feet, polydipsia, polyuria and visual disturbances. Last A1C in the office was:  Lab Results  Component Value Date   HGBA1C 5.2 02/14/2023   Patient is on Vitamin D supplement, taking daily supplement with K2.  Lab Results  Component Value Date   VD25OH 34 02/14/2023     He has been on finasteride for hair growth; also family history of prostate cancer in father, later in life. Has had several normal screenings. Denies LUTs. Declines check this year after discussion.  Lab Results  Component Value Date   PSA <0.1 10/23/2018   PSA 0.1 08/14/2016   PSA 0.02 08/11/2015   He was noted to have CKD stage II.  He tries to stay well hydrated and avoid high salt foods. Last GFR: Lab Results  Component Value Date   EGFR 55 (L) 02/14/2023     Current Medications:  Current Outpatient Medications on File Prior to Visit  Medication Sig Dispense Refill   aspirin EC 81 MG tablet Take 81 mg by mouth daily. Swallow whole.  azelastine (ASTELIN) 0.1 % nasal spray Place 2 sprays into both nostrils 2 (two) times daily. Use in each nostril as directed 30 mL 2   Black Pepper-Turmeric (TURMERIC COMPLEX/BLACK PEPPER PO) Take 1 tablet by mouth daily.     finasteride (PROSCAR) 5 MG tablet Take 1 tablet  Daily  for Prostate 90 tablet 3   tadalafil (CIALIS) 20 MG tablet Take 1 tablet (20 mg total) by mouth daily as needed for erectile dysfunction. 60 tablet 3   Vitamin D-Vitamin K (VITAMIN K2-VITAMIN D3 PO) Take 1 tablet by mouth daily. 5000 IU/90 mcg     amoxicillin-clavulanate (AUGMENTIN) 500-125 MG tablet Take 1 tablet by mouth in the morning and at bedtime. 10 tablet 0   busPIRone (BUSPAR) 10 MG tablet TAKE ONE TABLET BY MOUTH THREE TIMES A DAY AS NEEDED  FOR ANXIETY 90 tablet 5   cyclobenzaprine (FLEXERIL) 5 MG tablet Take 1 tablet (5 mg total) by mouth 3 (three) times daily as needed for muscle spasms. 15 tablet 0   escitalopram (LEXAPRO) 10 MG tablet Take 1 tablet (10 mg total) by mouth daily. 90 tablet 0   hydrOXYzine (VISTARIL) 25 MG capsule Take 1 capsule (25 mg total) by mouth 3 (three) times daily as needed. 30 capsule 0   Omega-3 Fatty Acids (FISH OIL) 1000 MG CAPS Take 1 tablet by mouth daily.     No current facility-administered medications on file prior to visit.    Immunization History  Administered Date(s) Administered   Tdap 11/02/2021     Health Maintenance  Topic Date Due   COVID-19 Vaccine (1) Never done   INFLUENZA VACCINE  Never done   Colonoscopy  02/16/2028   DTaP/Tdap/Td (2 - Td or Tdap) 11/03/2031   Hepatitis C Screening  Completed   HPV VACCINES  Aged Out   HIV Screening  Discontinued   Health Maintenance  Topic Date Due   COVID-19 Vaccine (1) Never done   INFLUENZA VACCINE  Never done   Colonoscopy  02/16/2028   DTaP/Tdap/Td (2 - Td or Tdap) 11/03/2031   Hepatitis C Screening  Completed   HPV VACCINES  Aged Out   HIV Screening  Discontinued    TD/TDAP: 11/2021 Influenza: declines covid 19: 3/3, moderna, 2021- send information  Sexually Active: yes married, declines STD testing  Colonoscopy: 02/2021, 2 polyps, 7 year recall per Dr. Rhea Belton  Last eye: 2022, glasses Last dental: 2024, goes q32m Last derm: 2022, benign biopsies per derm  Medical History:  Past Medical History:  Diagnosis Date   Allergy    Anxiety    Stricture, urethra    Dr. Isabel Caprice 2005   Allergies No Known Allergies  SURGICAL HISTORY He  has a past surgical history that includes Urethroplasty (06/2019) and OTHER SURGICAL HISTORY (2008). FAMILY HISTORY His family history includes Cancer (age of onset: 92) in his mother; Cancer (age of onset: 51) in his father; Proteinuria in his father. SOCIAL HISTORY He  reports that  he has never smoked. He has never used smokeless tobacco. He reports that he does not currently use alcohol. He reports that he does not currently use drugs after having used the following drugs: Marijuana.   Review of Systems: Review of Systems  Constitutional:  Negative for malaise/fatigue and weight loss.  HENT:  Negative for hearing loss and tinnitus.   Eyes:  Negative for blurred vision and double vision.  Respiratory:  Negative for cough, sputum production, shortness of breath and wheezing.   Cardiovascular:  Negative  for chest pain, palpitations, orthopnea, claudication, leg swelling and PND.  Gastrointestinal:  Negative for abdominal pain, blood in stool, constipation, diarrhea, heartburn, melena, nausea and vomiting.  Genitourinary: Negative.   Musculoskeletal:  Negative for falls, joint pain and myalgias.  Skin:  Negative for rash.  Neurological:  Negative for dizziness, tingling, sensory change, weakness and headaches.  Endo/Heme/Allergies:  Negative for polydipsia.  Psychiatric/Behavioral: Negative.  Negative for depression, memory loss, substance abuse and suicidal ideas. The patient is not nervous/anxious and does not have insomnia.   All other systems reviewed and are negative.   Physical Exam: Estimated body mass index is 28.07 kg/m as calculated from the following:   Height as of this encounter: 6' (1.829 m).   Weight as of this encounter: 207 lb (93.9 kg). BP 110/80   Pulse 63   Temp 97.8 F (36.6 C)   Ht 6' (1.829 m)   Wt 207 lb (93.9 kg)   SpO2 98%   BMI 28.07 kg/m   General Appearance: Well nourished, developed, good hygiene and dress, in no apparent distress.  Eyes: PERRLA, EOMs, conjunctiva no swelling or erythema Sinuses: + Frontal/maxillary tenderness  ENT/Mouth: Ext aud canals clear, normal light reflex with TMs without erythema or bulging. Good dentition. No erythema, swelling, or exudate on post pharynx. Tonsils not swollen or erythematous. Hearing  normal.  Neck: Supple, thyroid normal. No bruits  Respiratory: Respiratory effort normal, BS equal bilaterally without rales, rhonchi, wheezing or stridor.  Cardio: RRR without murmurs, rubs or gallops. Brisk peripheral pulses without edema.  Chest: symmetric, with normal excursions and percussion.  Abdomen: Soft, nontender, no guarding, rebound, hernias, masses, or organomegaly.  Lymphatics: Non tender without lymphadenopathy.  Genitourinary: defer, doing self exams, no concerns Musculoskeletal: Full ROM all peripheral extremities,5/5 strength, and normal gait.  Skin: Warm, dry without rashes, lesions, ecchymosis. He has extensive benign appearing nevi of torso.  Neuro: Cranial nerves intact, reflexes equal bilaterally. Normal muscle tone, no cerebellar symptoms. Sensation intact.  Psych: Awake and oriented X 3, normal affect, Insight and Judgment appropriate.   EKG: Sinus bradycardia, LVH, PVC   Valori Hollenkamp, NP-C 11:30 AM [facility]

## 2023-05-31 NOTE — Patient Instructions (Signed)

## 2023-06-01 ENCOUNTER — Encounter: Payer: BLUE CROSS/BLUE SHIELD | Admitting: Nurse Practitioner

## 2023-06-01 LAB — PSA: PSA: 0.23 ng/mL

## 2023-06-01 LAB — URINALYSIS, ROUTINE W REFLEX MICROSCOPIC
Bacteria, UA: NONE SEEN /[HPF]
Bilirubin Urine: NEGATIVE
Glucose, UA: NEGATIVE
Hgb urine dipstick: NEGATIVE
Hyaline Cast: NONE SEEN /[LPF]
Ketones, ur: NEGATIVE
Nitrite: NEGATIVE
Protein, ur: NEGATIVE
RBC / HPF: NONE SEEN /[HPF] (ref 0–2)
Specific Gravity, Urine: 1.011 (ref 1.001–1.035)
Squamous Epithelial / HPF: NONE SEEN /[HPF] (ref <=5)
WBC, UA: NONE SEEN /[HPF] (ref 0–5)
pH: 6.5 (ref 5.0–8.0)

## 2023-06-01 LAB — COMPLETE METABOLIC PANEL WITH GFR
AG Ratio: 2.2 (calc) (ref 1.0–2.5)
ALT: 26 U/L (ref 9–46)
AST: 31 U/L (ref 10–40)
Albumin: 4.8 g/dL (ref 3.6–5.1)
Alkaline phosphatase (APISO): 73 U/L (ref 36–130)
BUN/Creatinine Ratio: 16 (calc) (ref 6–22)
BUN: 22 mg/dL (ref 7–25)
CO2: 29 mmol/L (ref 20–32)
Calcium: 9.8 mg/dL (ref 8.6–10.3)
Chloride: 101 mmol/L (ref 98–110)
Creat: 1.4 mg/dL — ABNORMAL HIGH (ref 0.60–1.29)
Globulin: 2.2 g/dL (ref 1.9–3.7)
Glucose, Bld: 93 mg/dL (ref 65–99)
Potassium: 4.7 mmol/L (ref 3.5–5.3)
Sodium: 139 mmol/L (ref 135–146)
Total Bilirubin: 0.8 mg/dL (ref 0.2–1.2)
Total Protein: 7 g/dL (ref 6.1–8.1)
eGFR: 62 mL/min/{1.73_m2} (ref >=60)

## 2023-06-01 LAB — MAGNESIUM: Magnesium: 2.4 mg/dL (ref 1.5–2.5)

## 2023-06-01 LAB — MICROSCOPIC MESSAGE

## 2023-06-01 LAB — URINE CULTURE
MICRO NUMBER:: 15521724
Result:: NO GROWTH
SPECIMEN QUALITY:: ADEQUATE

## 2023-06-01 LAB — MICROALBUMIN / CREATININE URINE RATIO
Creatinine, Urine: 56 mg/dL (ref 20–320)
Microalb Creat Ratio: 4 mg/g{creat} (ref <30)
Microalb, Ur: 0.2 mg/dL

## 2023-06-04 ENCOUNTER — Encounter: Payer: Self-pay | Admitting: Nurse Practitioner

## 2023-06-06 DIAGNOSIS — F411 Generalized anxiety disorder: Secondary | ICD-10-CM | POA: Diagnosis not present

## 2023-08-13 ENCOUNTER — Ambulatory Visit: Payer: BLUE CROSS/BLUE SHIELD | Admitting: Nurse Practitioner

## 2023-09-12 DIAGNOSIS — F411 Generalized anxiety disorder: Secondary | ICD-10-CM | POA: Diagnosis not present

## 2023-10-06 ENCOUNTER — Encounter: Payer: Self-pay | Admitting: Nurse Practitioner

## 2023-10-08 ENCOUNTER — Ambulatory Visit: Payer: Self-pay | Admitting: Internal Medicine

## 2023-10-08 ENCOUNTER — Ambulatory Visit: Payer: BLUE CROSS/BLUE SHIELD | Admitting: Urgent Care

## 2023-10-08 VITALS — BP 136/84 | HR 67 | Temp 98.3°F | Ht 72.0 in | Wt 212.4 lb

## 2023-10-08 DIAGNOSIS — J209 Acute bronchitis, unspecified: Secondary | ICD-10-CM | POA: Diagnosis not present

## 2023-10-08 MED ORDER — GUAIFENESIN ER 600 MG PO TB12: 600.0000 mg | ORAL_TABLET | Freq: Two times a day (BID) | ORAL | 0 refills | Status: DC | PRN

## 2023-10-08 MED ORDER — DEXAMETHASONE 6 MG PO TABS: 6.0000 mg | ORAL_TABLET | Freq: Every day | ORAL | 0 refills | Status: DC

## 2023-10-08 MED ORDER — MONTELUKAST SODIUM 10 MG PO TABS: 10.0000 mg | ORAL_TABLET | Freq: Every day | ORAL | 0 refills | Status: DC

## 2023-10-08 NOTE — Telephone Encounter (Signed)
Chief Complaint: SOB Symptoms: mild SOB, cough, night sweats Frequency: symptoms began two Sundays ago, feels like infection moved into chest last Friay Pertinent Negatives: Patient denies CP Disposition: [] ED /[] Urgent Care (no appt availability in office) / [x] Appointment(In office/virtual)/ []  [facility]/ [] Home Care/ [] Refused Recommended Disposition /[] [facility]/ []  Follow-up with PCP Additional Notes: Pt states two Sundays ago he developed chills, fever, and night sweats. Pt states last Friday "it moved into my chest." Pt reports productive cough with yellow mucus and mild SOB at rest. Pt states his SOB worsens with stairs. Pt denies CP. Pt states he is very active and goes to the gym every day. Pt's HR with his Apple Watch is 58-64. Pt does not own a pulse ox. Pt would like to be established with a new PCP as his prior PCP passed away. Per protocol, pt scheduled for today at [facility]. Pt states his wife can take him. RN advised pt to call 911 if his SOB worsens or he becomes SOB at rest or for any worsening symptoms. Pt verbalized understanding.   Copied from CRM [id number]. Topic: Clinical - Red Word Triage >> Oct 08, 2023  8:58 AM Orinda Kenner C wrote: Red Word that prompted transfer to Nurse Triage: Patient [phone] had a fever last week, chest congestion, coughing phlegm clear yellowish in color, shortness of breath, wheezing, dizziness, sweating, Covid and Flu at home test is negative. Patient pcp Dr. Lucky Cowboy passed away last week and he is looking for a new provider. Reason for Disposition  [1] MILD difficulty breathing (e.g., minimal/no SOB at rest, SOB with walking, pulse <100) AND [2] NEW-onset or WORSE than normal  Answer Assessment - Initial Assessment Questions 1. RESPIRATORY STATUS: "Describe your breathing?" (e.g., wheezing, shortness of breath, unable to speak, severe coughing)      Shortness of breath, "tight", lightheaded. "A little  wheezing", went walking last night, stairs in the house "kind of get me" 2. ONSET: "When did this breathing problem begin?"      Symptoms started last Sunday. Monday I went to the office and worked, started getting cold chills, had a fever, took Nyquil." Kept happening day after day. Friday it subdued, still had night sweats, then it started moving into my chest. Feels like congestion. 3. PATTERN "Does the difficult breathing come and go, or has it been constant since it started?"      Symptoms last Sunday, moved into chest Friday 4. SEVERITY: "How bad is your breathing?" (e.g., mild, moderate, severe)    - MILD: No SOB at rest, mild SOB with walking, speaks normally in sentences, can lie down, no retractions, pulse < 100.    - MODERATE: SOB at rest, SOB with minimal exertion and prefers to sit, cannot lie down flat, speaks in phrases, mild retractions, audible wheezing, pulse 100-120.    - SEVERE: Very SOB at rest, speaks in single words, struggling to breathe, sitting hunched forward, retractions, pulse > 120      "Steps really take my breath", work out a ton 5. RECURRENT SYMPTOM: "Have you had difficulty breathing before?" If Yes, ask: "When was the last time?" and "What happened that time?"      No 6. CARDIAC HISTORY: "Do you have any history of heart disease?" (e.g., heart attack, angina, bypass surgery, angioplasty)      HR runs low, pt states he exercises a lot, otherwise no cardiac hx. Takes an aspirin daily. 7. LUNG HISTORY: "Do you have any  history of lung disease?"  (e.g., pulmonary embolus, asthma, emphysema)     No. 8. CAUSE: "What do you think is causing the breathing problem?"       9. OTHER SYMPTOMS: "Do you have any other symptoms? (e.g., dizziness, runny nose, cough, chest pain, fever)     Chest congestion, productive cough with clear mucus, night sweats (happened last night - havent taken any Nyquil), dizziness/lightheaded (random). COVID and flu negative. HR 58-64 at rest  [employer]). Chest feels tight.  10. O2 SATURATION MONITOR:  "Do you use an oxygen saturation monitor (pulse oximeter) at home?" If Yes, ask: "What is your reading (oxygen level) today?" "What is your usual oxygen saturation reading?" (e.g., 95%)       HR 58-64 with Apple watch, doesn't have an pulse ox  Protocols used: Breathing Difficulty-A-AH

## 2023-10-08 NOTE — Progress Notes (Signed)
New Patient Office Visit  Subjective:  Patient ID: Willie Romero, male    DOB: Jul 07, 1976  Age: 48 y.o. MRN: [id number]  CC:  Chief Complaint  Patient presents with   Establish Care    New pt est care. He has also been sick for about 2 weeks now. Symptoms consisted of fever, chills and cough. This week he has had chest congestion and light yellow phlegm.     HPI Willie Romero presents to address an acute issue, and consider establishing care.  Discussed the use of AI scribe software for clinical note transcription with the patient, who gave verbal consent to proceed.  History of Present Illness   He presents with respiratory symptoms including cough and chest tightness.  He began feeling unwell last Sunday with cold chills and fever, experiencing cold sweats and fever daily unless medicated with Nyquil and DayQuil. By Friday, he started feeling better, but symptoms progressed to include a persistent cough and chest tightness. He describes difficulty taking a full deep breath and some wheezing, particularly at night, accompanied by cold sweats.  He tested negative for COVID-19 and flu using home tests on Tuesday and Wednesday, three days after symptom onset. He reports sneezing and a sore throat, with some postnasal drainage. His cough is sometimes dry and sometimes produces clear mucus, with no blood present. He experienced a headache and some ear pressure earlier in the illness. No significant head pain or ear pain currently. He has maintained his sense of taste and smell.  His current medications include finasteride, vitamin D, and aspirin, which he usually takes before going to the gym. He has not taken Cipro or azelastine recently.  He works as a Transport planner and is not exposed to chemicals or fumes at work. He does not smoke and has no exposure to young children or elderly individuals.       Outpatient Encounter Medications as of 10/08/2023  Medication Sig   aspirin EC 81 MG tablet  Take 81 mg by mouth daily. Swallow whole.   dexamethasone (DECADRON) 6 MG tablet Take 1 tablet (6 mg total) by mouth daily with breakfast.   finasteride (PROSCAR) 5 MG tablet Take 1 tablet  Daily  for Prostate   guaiFENesin (MUCINEX) 600 MG 12 hr tablet Take 1 tablet (600 mg total) by mouth 2 (two) times daily as needed for to loosen phlegm or cough.   montelukast (SINGULAIR) 10 MG tablet Take 1 tablet (10 mg total) by mouth at bedtime.   tadalafil (CIALIS) 20 MG tablet Take 1 tablet (20 mg total) by mouth daily as needed for erectile dysfunction.   Vitamin D-Vitamin K (VITAMIN K2-VITAMIN D3 PO) Take 1 tablet by mouth daily. 5000 IU/90 mcg   [DISCONTINUED] azelastine (ASTELIN) 0.1 % nasal spray Place 2 sprays into both nostrils 2 (two) times daily. Use in each nostril as directed   [DISCONTINUED] Black Pepper-Turmeric (TURMERIC COMPLEX/BLACK PEPPER PO) Take 1 tablet by mouth daily. (Patient not taking: Reported on 10/08/2023)   [DISCONTINUED] ciprofloxacin (CIPRO) 250 MG tablet Take 1 tablet 2 x /day with Food for Infection (Patient not taking: Reported on 10/08/2023)   No facility-administered encounter medications on file as of 10/08/2023.    Past Medical History:  Diagnosis Date   Allergy    Anxiety    Stricture, urethra    Dr. Isabel Caprice 2005    Past Surgical History:  Procedure Laterality Date   OTHER SURGICAL HISTORY  2008   Urethral dilation, Dr. Isabel Caprice  URETHROPLASTY  06/2019   Dr. Marlou Porch    Family History  Problem Relation Age of Onset   Cancer Mother 47       lung, bone metz, smoker   Cancer Father 34       prostate   Proteinuria Father        due to amyloidosis   Colon polyps Neg Hx    Colon cancer Neg Hx    Stomach cancer Neg Hx    Rectal cancer Neg Hx     Social History   Socioeconomic History   Marital status: Single    Spouse name: Not on file   Number of children: Not on file   Years of education: Not on file   Highest education level: Some college, no  degree  Occupational History   Not on file  Tobacco Use   Smoking status: Never   Smokeless tobacco: Never  Vaping Use   Vaping status: Never Used  Substance and Sexual Activity   Alcohol use: Not Currently    Alcohol/week: 0.0 - 3.0 standard drinks of alcohol    Comment: social on occasion   Drug use: Not Currently    Types: Marijuana    Comment: rare   Sexual activity: Yes    Partners: Female    Birth control/protection: I.U.D.  Other Topics Concern   Not on file  Social History Narrative   Not on file   Social Drivers of Health   Financial Resource Strain: Low Risk  (10/08/2023)   Overall Financial Resource Strain (CARDIA)    Difficulty of Paying Living Expenses: Not hard at all  Food Insecurity: No Food Insecurity (10/08/2023)   Hunger Vital Sign    Worried About Running Out of Food in the Last Year: Never true    Ran Out of Food in the Last Year: Never true  Transportation Needs: No Transportation Needs (10/08/2023)   PRAPARE - Administrator, Civil Service (Medical): No    Lack of Transportation (Non-Medical): No  Physical Activity: Sufficiently Active (10/08/2023)   Exercise Vital Sign    Days of Exercise per Week: 7 days    Minutes of Exercise per Session: 60 min  Stress: Stress Concern Present (10/08/2023)   [employer] of Occupational Health - Occupational Stress Questionnaire    Feeling of Stress : To some extent  Social Connections: Socially Integrated (10/08/2023)   Social Connection and Isolation Panel [NHANES]    Frequency of Communication with Friends and Family: Twice a week    Frequency of Social Gatherings with Friends and Family: Once a week    Attends Religious Services: More than 4 times per year    Active Member of [employer] or Organizations: Yes    Attends Banker Meetings: More than 4 times per year    Marital Status: Married  Catering manager Violence: Unknown (09/12/2022)   Received from [employer], [facility]   HITS     Physically Hurt: Not on file    Insult or Talk Down To: Not on file    Threaten Physical Harm: Not on file    Scream or Curse: Not on file    ROS: as noted in HPI  Objective:  BP 136/84   Pulse 67   Temp 98.3 F (36.8 C) (Oral)   Ht 6' (1.829 m)   Wt 212 lb 6.4 oz (96.3 kg)   SpO2 98%   BMI 28.81 kg/m   Physical Exam Vitals and nursing note reviewed.  Exam conducted with a chaperone present.  Constitutional:      General: He is not in acute distress.    Appearance: Normal appearance. He is not ill-appearing, toxic-appearing or diaphoretic.  HENT:     Head: Normocephalic and atraumatic.     Right Ear: Tympanic membrane, ear canal and external ear normal. No drainage, swelling or tenderness. No middle ear effusion. There is no impacted cerumen. Tympanic membrane is not scarred, perforated or erythematous.     Left Ear: Tympanic membrane, ear canal and external ear normal. No drainage, swelling or tenderness.  No middle ear effusion. There is no impacted cerumen. Tympanic membrane is not scarred, perforated or erythematous.     Nose: Septal deviation (to R) and rhinorrhea present. Rhinorrhea is clear.     Right Turbinates: Not enlarged or swollen.     Left Turbinates: Not enlarged or swollen.     Right Sinus: No maxillary sinus tenderness or frontal sinus tenderness.     Left Sinus: No maxillary sinus tenderness or frontal sinus tenderness.     Mouth/Throat:     Lips: Pink.     Mouth: Mucous membranes are moist.     Pharynx: Oropharynx is clear. Uvula midline. No pharyngeal swelling, oropharyngeal exudate, posterior oropharyngeal erythema, uvula swelling or postnasal drip.  Cardiovascular:     Rate and Rhythm: Normal rate and regular rhythm.     Heart sounds: No murmur heard.    No friction rub. No gallop.  Pulmonary:     Effort: Pulmonary effort is normal. No respiratory distress.     Breath sounds: Normal breath sounds. No stridor. No wheezing, rhonchi or rales.  Chest:      Chest wall: No tenderness.  Musculoskeletal:     Cervical back: Normal range of motion and neck supple. No rigidity or tenderness.  Lymphadenopathy:     Cervical: No cervical adenopathy.  Skin:    General: Skin is warm and dry.     Coloration: Skin is not jaundiced.     Findings: No bruising, erythema or rash.  Neurological:     Mental Status: He is alert.     Last CBC Lab Results  Component Value Date   WBC 8.1 05/31/2023   HGB 15.2 05/31/2023   HCT 45.8 05/31/2023   MCV 89.1 05/31/2023   MCH 29.6 05/31/2023   RDW 11.8 05/31/2023   PLT 299 05/31/2023   Last metabolic panel Lab Results  Component Value Date   GLUCOSE 93 05/31/2023   NA 139 05/31/2023   K 4.7 05/31/2023   CL 101 05/31/2023   CO2 29 05/31/2023   BUN 22 05/31/2023   CREATININE 1.40 (H) 05/31/2023   EGFR 62 05/31/2023   CALCIUM 9.8 05/31/2023   PROT 7.0 05/31/2023   ALBUMIN 4.4 08/14/2016   BILITOT 0.8 05/31/2023   ALKPHOS 71 08/14/2016   AST 31 05/31/2023   ALT 26 05/31/2023   Last lipids Lab Results  Component Value Date   CHOL 151 02/14/2023   HDL 73 02/14/2023   LDLCALC 66 02/14/2023   TRIG 42 02/14/2023   CHOLHDL 2.1 02/14/2023   Last hemoglobin A1c Lab Results  Component Value Date   HGBA1C 5.2 02/14/2023   Last thyroid functions Lab Results  Component Value Date   TSH 1.44 02/14/2023      Assessment & Plan:  Acute tracheobronchitis -     dexAMETHasone; Take 1 tablet (6 mg total) by mouth daily with breakfast.  Dispense: 4 tablet; Refill: 0 -  Montelukast Sodium; Take 1 tablet (10 mg total) by mouth at bedtime.  Dispense: 14 tablet; Refill: 0 -     guaiFENesin ER; Take 1 tablet (600 mg total) by mouth 2 (two) times daily as needed for to loosen phlegm or cough.  Dispense: 20 tablet; Refill: 0  Assessment and Plan    Acute Tracheobronchitis Symptoms started last Sunday with fever, chills, and malaise, which improved by Friday. Over the weekend, developed cough, chest  tightness, and difficulty taking deep breaths. No history of chronic lung disease. No exposure to chemicals or fumes. No evidence of pneumonia on examination. -Start short course of Dexamethasone to reduce inflammation and mucus production. -Start Singulair (Montelukast) at night to help open airways. -Continue Mucinex to help expectorate mucus. -Check-in via MyChart in 3-4 days or sooner if symptoms worsen or fever recurs.  Upper Respiratory Symptoms Reports sneezing, pressure in ears, and postnasal drainage. No facial pain or pressure. -Continue symptomatic treatment with DayQuil/Nyquil as needed.  General Health Maintenance -schedule annual PE around end of Sept this year    Return in about 34 weeks (around 06/02/2024) for Annual Physical.   Maretta Bees, PA

## 2023-10-08 NOTE — Telephone Encounter (Signed)
 No further action needed.

## 2023-10-08 NOTE — Patient Instructions (Addendum)
You have tracheobronchitis, most likely viral.  Please take one tablet of dexamethasone in the morning with breakfast. Drink plenty of water while taking this medication.  Please take mucinex 600mg  twice daily. This is an expectorant, it will help break up the phlegm/ mucous to get our of your chest. Please cough it up and spit it out.  Consider using the steam from a hot shower and breath this in deeply. It can help break up any mucous from your chest.  Take one tablet of montelukast at night before bed. This medication can make you feel sleepy. Once your symptoms are resolved, stop taking this.   If you choose to make our office your PCP, please schedule an annual physical around Sept 2025

## 2023-10-12 ENCOUNTER — Other Ambulatory Visit: Payer: Self-pay | Admitting: Urgent Care

## 2023-10-12 ENCOUNTER — Encounter: Payer: Self-pay | Admitting: Urgent Care

## 2023-10-12 MED ORDER — AZITHROMYCIN 250 MG PO TABS: ORAL_TABLET | ORAL | 0 refills | Status: AC

## 2023-10-12 MED ORDER — HYDROCODONE BIT-HOMATROP MBR 5-1.5 MG/5ML PO SOLN: 5.0000 mL | Freq: Every evening | ORAL | 0 refills | Status: DC | PRN

## 2024-02-19 DIAGNOSIS — R41840 Attention and concentration deficit: Secondary | ICD-10-CM | POA: Diagnosis not present

## 2024-02-19 DIAGNOSIS — Z79899 Other long term (current) drug therapy: Secondary | ICD-10-CM | POA: Diagnosis not present

## 2024-02-27 DIAGNOSIS — Z79899 Other long term (current) drug therapy: Secondary | ICD-10-CM | POA: Diagnosis not present

## 2024-02-27 DIAGNOSIS — R41840 Attention and concentration deficit: Secondary | ICD-10-CM | POA: Diagnosis not present

## 2024-04-07 ENCOUNTER — Ambulatory Visit: Admitting: Family Medicine

## 2024-04-07 ENCOUNTER — Ambulatory Visit: Payer: Self-pay

## 2024-04-07 VITALS — BP 128/80 | HR 69 | Temp 98.6°F | Ht 72.0 in | Wt 204.4 lb

## 2024-04-07 DIAGNOSIS — R197 Diarrhea, unspecified: Secondary | ICD-10-CM | POA: Diagnosis not present

## 2024-04-07 DIAGNOSIS — F902 Attention-deficit hyperactivity disorder, combined type: Secondary | ICD-10-CM | POA: Diagnosis not present

## 2024-04-07 DIAGNOSIS — R6883 Chills (without fever): Secondary | ICD-10-CM

## 2024-04-07 DIAGNOSIS — Z79899 Other long term (current) drug therapy: Secondary | ICD-10-CM | POA: Diagnosis not present

## 2024-04-07 LAB — COMPREHENSIVE METABOLIC PANEL WITH GFR
ALT: 12 U/L (ref 0–53)
AST: 18 U/L (ref 0–37)
Albumin: 4.1 g/dL (ref 3.5–5.2)
Alkaline Phosphatase: 65 U/L (ref 39–117)
BUN: 11 mg/dL (ref 6–23)
CO2: 29 meq/L (ref 19–32)
Calcium: 9.1 mg/dL (ref 8.4–10.5)
Chloride: 103 meq/L (ref 96–112)
Creatinine, Ser: 1.27 mg/dL (ref 0.40–1.50)
GFR: 66.68 mL/min (ref >60.00)
Glucose, Bld: 87 mg/dL (ref 70–99)
Potassium: 4.4 meq/L (ref 3.5–5.1)
Sodium: 140 meq/L (ref 135–145)
Total Bilirubin: 0.5 mg/dL (ref 0.2–1.2)
Total Protein: 6.8 g/dL (ref 6.0–8.3)

## 2024-04-07 LAB — CBC WITH DIFFERENTIAL/PLATELET
Basophils Absolute: 0 10*3/uL (ref 0.0–0.1)
Basophils Relative: 0.6 % (ref 0.0–3.0)
Eosinophils Absolute: 0.1 10*3/uL (ref 0.0–0.7)
Eosinophils Relative: 2.3 % (ref 0.0–5.0)
HCT: 42.8 % (ref 39.0–52.0)
Hemoglobin: 14.6 g/dL (ref 13.0–17.0)
Lymphocytes Relative: 33.2 % (ref 12.0–46.0)
Lymphs Abs: 1.8 10*3/uL (ref 0.7–4.0)
MCHC: 34.1 g/dL (ref 30.0–36.0)
MCV: 86.1 fl (ref 78.0–100.0)
Monocytes Absolute: 1.3 10*3/uL — ABNORMAL HIGH (ref 0.1–1.0)
Monocytes Relative: 23 % — ABNORMAL HIGH (ref 3.0–12.0)
Neutro Abs: 2.2 10*3/uL (ref 1.4–7.7)
Neutrophils Relative %: 40.9 % — ABNORMAL LOW (ref 43.0–77.0)
Platelets: 250 10*3/uL (ref 150.0–400.0)
RBC: 4.97 Mil/uL (ref 4.22–5.81)
RDW: 12.5 % (ref 11.5–15.5)
WBC: 5.5 10*3/uL (ref 4.0–10.5)

## 2024-04-07 LAB — POC COVID19 BINAXNOW: SARS Coronavirus 2 Ag: NEGATIVE

## 2024-04-07 NOTE — Patient Instructions (Addendum)
 We are checking labs today, will be in contact with any results that require further attention

## 2024-04-07 NOTE — Telephone Encounter (Signed)
 FYI Only or Action Required?: FYI only for provider.  Patient was last seen in primary care on 10/08/2023 by Lowella Benton CROME, PA.  Called Nurse Triage reporting Diarrhea.  Symptoms began a week ago.  Interventions attempted: OTC medications: imodium.  Symptoms are: gradually improving.  Triage Disposition: See Physician Within 24 Hours  Patient/caregiver understands and will follow disposition?: Yes                   Copied from CRM [id number]. Topic: Clinical - Red Word Triage >> Apr 07, 2024  8:38 AM Robinson H wrote: [facility] that prompted transfer to Nurse Triage: Got back from [place] last Tuesday, travelers diarrhea, fever, cold chills lasted, stomach is bloated, sharp pain thinks he might have a parasite not sure Reason for Disposition  [1] MODERATE diarrhea (e.g., 4-6 times / day more than normal) AND [2] present > 48 hours (2 days)  Answer Assessment - Initial Assessment Questions Patient states he was in the [place] for 5 days Stomach cramps and diarrhea 2 days before leaving Got home last Wed; pt was not eating, pure water diarrhea, sharp pains in stomach, fever (102 F); fever now gone and lasted 1-2 days Pt thought it was travelers diarrhea- took imodium Yesterday tried to eat yesterday; still diarrhea Pt is concerned it is a parasite Ongoing for 9 days Current symptoms: diarrhea (5-6), bloating, intermittent stomach pain (getting better per pt, 4-5/10 pain level) Denies vomiting, blood in stool, signs of dehydration Denies taking antibiotics within past 2 months  Protocols used: [facility]

## 2024-04-07 NOTE — Progress Notes (Signed)
 Acute Office Visit  Subjective:     Patient ID: Willie Romero, male    DOB: May 16, 1976, 48 y.o.   MRN: [id number]  Chief Complaint  Patient presents with   Acute Visit    Diarrhea ongoing for about 8 days, occasional stomach cramping. Had a fever and chills on Wednesday    HPI 48 year old male presents for evaluation of weeklong diarrhea that is worse with eating after traveling to [place] and the [place]. States that he had a couple of days of fever and chills, none over the last 2 days.  Has been taking Imodium with some relief.  Has been able to keep fluids. Concern for parasite or bacterial infection.  ROS Per HPI      Objective:    BP 128/80 (BP Location: Left Arm, Patient Position: Sitting)   Pulse 69   Temp 98.6 F (37 C) (Temporal)   Ht 6' (1.829 m)   Wt 204 lb 6.4 oz (92.7 kg)   SpO2 97%   BMI 27.72 kg/m    Physical Exam Vitals and nursing note reviewed.  Constitutional:      General: He is not in acute distress.    Appearance: Normal appearance.  HENT:     Head: Normocephalic and atraumatic.     Right Ear: External ear normal.     Left Ear: External ear normal.     Nose: Nose normal.     Mouth/Throat:     Mouth: Mucous membranes are moist.     Pharynx: Oropharynx is clear.  Eyes:     Extraocular Movements: Extraocular movements intact.  Cardiovascular:     Rate and Rhythm: Normal rate and regular rhythm.     Pulses: Normal pulses.     Heart sounds: Normal heart sounds.  Pulmonary:     Effort: Pulmonary effort is normal. No respiratory distress.     Breath sounds: Normal breath sounds. No wheezing, rhonchi or rales.  Abdominal:     Comments: Unable to tolerate  Musculoskeletal:        General: Normal range of motion.     Cervical back: Normal range of motion.     Right lower leg: No edema.     Left lower leg: No edema.  Lymphadenopathy:     Cervical: No cervical adenopathy.  Skin:    General: Skin is warm and dry.  Neurological:      General: No focal deficit present.     Mental Status: He is alert and oriented to person, place, and time.  Psychiatric:        Mood and Affect: Mood normal.        Behavior: Behavior normal.     Results for orders placed or performed in visit on 04/07/24  POC COVID-19 BinaxNow  Result Value Ref Range   SARS Coronavirus 2 Ag Negative Negative        Assessment & Plan:   Chills -     POC COVID-19 BinaxNow -     CBC with Differential/Platelet -     Comprehensive metabolic panel with GFR -     Gastrointestinal Panel by PCR , Stool -     GI Profile, Stool, PCR  Diarrhea, unspecified type -     CBC with Differential/Platelet -     Comprehensive metabolic panel with GFR -     Gastrointestinal Panel by PCR , Stool -     GI Profile, Stool, PCR   Suspect infectious cause of diarrhea, will need  stool profile to ensure accurate treatment   Orders Placed This Encounter  Procedures   Gastrointestinal Panel by PCR , Stool   CBC w/Diff   Comp Met (CMET)   GI Profile, Stool, PCR   POC COVID-19 BinaxNow     No orders of the defined types were placed in this encounter.   Return if symptoms worsen or fail to improve.  Corean LITTIE Ku, FNP

## 2024-04-08 ENCOUNTER — Ambulatory Visit: Payer: Self-pay | Admitting: Family Medicine

## 2024-04-08 DIAGNOSIS — A0472 Enterocolitis due to Clostridium difficile, not specified as recurrent: Secondary | ICD-10-CM

## 2024-04-09 ENCOUNTER — Encounter: Payer: Self-pay | Admitting: Family Medicine

## 2024-04-09 ENCOUNTER — Other Ambulatory Visit: Payer: Self-pay

## 2024-04-09 MED ORDER — SACCHAROMYCES BOULARDII 250 MG PO CAPS: 250.0000 mg | ORAL_CAPSULE | Freq: Two times a day (BID) | ORAL | 1 refills | Status: AC

## 2024-04-09 MED ORDER — FIDAXOMICIN 200 MG PO TABS: 200.0000 mg | ORAL_TABLET | Freq: Two times a day (BID) | ORAL | 0 refills | Status: DC

## 2024-04-09 MED ORDER — FINASTERIDE 5 MG PO TABS: ORAL_TABLET | ORAL | 3 refills | Status: AC

## 2024-04-09 MED ORDER — CIPROFLOXACIN HCL 250 MG PO TABS: 250.0000 mg | ORAL_TABLET | Freq: Two times a day (BID) | ORAL | 0 refills | Status: DC

## 2024-04-09 NOTE — Telephone Encounter (Signed)
 Hi Stephanie,  FYI --   C diff and Salmonella (+)  I spoke w/pt: he came [place] from [place] wk ago 3 loose stools a day No fever, chills, pain  Rx's sent  Precautions discussed Imodium AD prn  RTC to see Corean in 7-10 d  Thx

## 2024-04-10 ENCOUNTER — Other Ambulatory Visit: Payer: Self-pay | Admitting: Family Medicine

## 2024-04-10 DIAGNOSIS — A0472 Enterocolitis due to Clostridium difficile, not specified as recurrent: Secondary | ICD-10-CM

## 2024-04-10 MED ORDER — VANCOMYCIN HCL 125 MG PO CAPS: 125.0000 mg | ORAL_CAPSULE | Freq: Four times a day (QID) | ORAL | 0 refills | Status: DC

## 2024-04-10 MED ORDER — VANCOMYCIN HCL 125 MG PO CAPS: 125.0000 mg | ORAL_CAPSULE | Freq: Four times a day (QID) | ORAL | 0 refills | Status: AC

## 2024-04-10 NOTE — Progress Notes (Signed)
Provider spoke with patient over the phone

## 2024-05-30 ENCOUNTER — Encounter: Payer: BLUE CROSS/BLUE SHIELD | Admitting: Nurse Practitioner

## 2024-06-02 ENCOUNTER — Encounter: Payer: Self-pay | Admitting: Urgent Care

## 2024-06-02 ENCOUNTER — Ambulatory Visit (INDEPENDENT_AMBULATORY_CARE_PROVIDER_SITE_OTHER): Admitting: Urgent Care

## 2024-06-02 VITALS — BP 117/75 | HR 66 | Ht 72.0 in | Wt 215.0 lb

## 2024-06-02 DIAGNOSIS — R140 Abdominal distension (gaseous): Secondary | ICD-10-CM | POA: Diagnosis not present

## 2024-06-02 DIAGNOSIS — Z0000 Encounter for general adult medical examination without abnormal findings: Secondary | ICD-10-CM

## 2024-06-02 DIAGNOSIS — Z8619 Personal history of other infectious and parasitic diseases: Secondary | ICD-10-CM | POA: Diagnosis not present

## 2024-06-02 NOTE — Progress Notes (Unsigned)
 Complete physical exam  Patient: Willie Romero   DOB: May 10, 1976   48 y.o. Male  MRN: [id number]  Subjective:    Chief Complaint  Patient presents with   Annual Exam    Willie Romero is a 48 y.o. male who presents today for a complete physical exam. He reports consuming a general diet. Gym/ health club routine includes cardio and mod to heavy weightlifting. He generally feels fairly well. He reports sleeping well. He does have additional problems to discuss today.   Discussed the use of AI scribe software for clinical note transcription with the patient, who gave verbal consent to proceed.  History of Present Illness   Willie Romero is a 48 year old male who presents with persistent gastrointestinal symptoms following a C. difficile infection.  Approximately two months ago, he developed a C. difficile infection after a trip to the [place], where he also contracted salmonella. He was treated with vancomycin  for ten days. Despite treatment, he continues to experience bloating, loose stools, indigestion, and heartburn. He reports a significant drop in energy levels and weight loss since the infection.  Initially, he experienced diarrhea and bloating while in the [place], which he attributed to traveler's diarrhea. He delayed seeking medical attention for about a week after returning home. His white blood cell count was elevated during the initial workup, which confirmed the C. difficile infection.  He takes several medications and supplements, including fluvoxamine 50 mg for anxiety, finasteride , Flomax , Florastor, vitamin D  and K, ashwagandha, magnesium L-threonate, and creatine. His physical activity has decreased significantly over the past month and a half, and he has not been following his usual gym routine of five to seven days a week.  He describes feeling 'drunk' or 'hungover' in the mornings despite not consuming alcohol. He has not had any recent lab work aside  from the tests related to the C. difficile infection.  He follows a generalized diet but does not consume much seafood. He reports good sleep quality and follows with a dental specialist but has not seen a vision specialist in a couple of years. He mentions having a dry scalp but no other skin concerns.       Most recent fall risk assessment:    10/08/2023    2:12 PM  Fall Risk   Falls in the past year? 0  Number falls in past yr: 0  Injury with Fall? 0  Risk for fall due to : No Fall Risks  Follow up Falls evaluation completed     Most recent depression screenings:    06/02/2024    3:59 PM 10/08/2023    2:12 PM  PHQ 2/9 Scores  PHQ - 2 Score 2 0  PHQ- 9 Score 11 7    Vision:Not within last year  and Dental: No current dental problems and Receives regular dental care  Patient Active Problem List   Diagnosis Date Noted   Family history of amyloidosis 11/03/2021   Abnormal renal function 11/03/2021   LVH (left ventricular hypertrophy) 11/02/2021   History of adenomatous polyp of colon 11/01/2021   Erectile dysfunction 10/29/2020   Anxiety    Past Medical History:  Diagnosis Date   Allergy    Anxiety    Stricture, urethra    Dr. Alline 2005   Past Surgical History:  Procedure Laterality Date   OTHER SURGICAL HISTORY  2008   Urethral dilation, Dr. Alline   URETHROPLASTY  06/2019   Dr. Cam   Social  History   Tobacco Use   Smoking status: Never   Smokeless tobacco: Never  Vaping Use   Vaping status: Never Used  Substance Use Topics   Alcohol use: Yes    Alcohol/week: 4.0 - 7.0 standard drinks of alcohol    Types: 4 Cans of beer per week    Comment: social on occasion   Drug use: Not Currently    Types: Marijuana    Comment: rare      Patient Care Team: Lowella Benton [place], [place] as PCP - General (Physician Assistant)   Outpatient Medications Prior to Visit  Medication Sig   ASHWAGANDHA PO Take by mouth.   aspirin EC 81 MG tablet Take 81 mg by mouth  daily. Swallow whole.   Creatine POWD by Does not apply route.   finasteride  (PROSCAR ) 5 MG tablet Take 1 tablet  Daily  for Prostate   fluvoxaMINE (LUVOX) 50 MG tablet Take 50 mg by mouth at bedtime.   MAGNESIUM LACTATE PO Take by mouth.   saccharomyces boulardii (FLORASTOR) 250 MG capsule Take 1 capsule (250 mg total) by mouth 2 (two) times daily.   tadalafil  (CIALIS ) 20 MG tablet Take 1 tablet (20 mg total) by mouth daily as needed for erectile dysfunction.   Vitamin D -Vitamin K (VITAMIN K2-VITAMIN D3 PO) Take 1 tablet by mouth daily. 5000 IU/90 mcg   [DISCONTINUED] dexamethasone  (DECADRON ) 6 MG tablet Take 1 tablet (6 mg total) by mouth daily with breakfast. (Patient not taking: Reported on 06/02/2024)   [DISCONTINUED] guaiFENesin  (MUCINEX ) 600 MG 12 hr tablet Take 1 tablet (600 mg total) by mouth 2 (two) times daily as needed for to loosen phlegm or cough. (Patient not taking: Reported on 06/02/2024)   [DISCONTINUED] HYDROcodone  bit-homatropine (HYCODAN) 5-1.5 MG/5ML syrup Take 5 mLs by mouth at bedtime as needed for cough. (Patient not taking: Reported on 06/02/2024)   [DISCONTINUED] montelukast  (SINGULAIR ) 10 MG tablet Take 1 tablet (10 mg total) by mouth at bedtime. (Patient not taking: Reported on 06/02/2024)   No facility-administered medications prior to visit.    ROS Complete 12 point ROS performed with all pertinent positives listed in HPI      Objective:     BP 117/75   Pulse 66   Ht 6' (1.829 m)   Wt 215 lb (97.5 kg)   SpO2 98%   BMI 29.16 kg/m  BP Readings from Last 3 Encounters:  06/02/24 117/75  04/07/24 128/80  10/08/23 136/84   Wt Readings from Last 3 Encounters:  06/02/24 215 lb (97.5 kg)  04/07/24 204 lb 6.4 oz (92.7 kg)  10/08/23 212 lb 6.4 oz (96.3 kg)      Physical Exam Vitals and nursing note reviewed.  Constitutional:      General: He is not in acute distress.    Appearance: Normal appearance. He is not ill-appearing, toxic-appearing or  diaphoretic.  HENT:     Head: Normocephalic and atraumatic.     Right Ear: Tympanic membrane, ear canal and external ear normal. There is no impacted cerumen.     Left Ear: Tympanic membrane, ear canal and external ear normal. There is no impacted cerumen.     Nose: Nose normal.     Mouth/Throat:     Mouth: Mucous membranes are moist.     Pharynx: Oropharynx is clear. No oropharyngeal exudate or posterior oropharyngeal erythema.  Eyes:     General: No scleral icterus.       Right eye: No discharge.  Left eye: No discharge.     Extraocular Movements: Extraocular movements intact.     Pupils: Pupils are equal, round, and reactive to light.  Neck:     Thyroid : No thyroid  mass, thyromegaly or thyroid  tenderness.  Cardiovascular:     Rate and Rhythm: Normal rate and regular rhythm.     Pulses: Normal pulses.     Heart sounds: No murmur heard. Pulmonary:     Effort: Pulmonary effort is normal. No respiratory distress.     Breath sounds: Normal breath sounds. No stridor. No wheezing or rhonchi.  Abdominal:     General: Abdomen is flat. Bowel sounds are normal. There is no distension.     Palpations: Abdomen is soft. There is no mass.     Tenderness: There is no abdominal tenderness. There is no guarding.     Comments: Mild generalized discomfort without acute abdomen or peritonitis  Musculoskeletal:     Cervical back: Normal range of motion and neck supple. No rigidity or tenderness.     Right lower leg: No edema.     Left lower leg: No edema.  Lymphadenopathy:     Cervical: No cervical adenopathy.  Skin:    General: Skin is warm and dry.     Coloration: Skin is not jaundiced.     Findings: No bruising, erythema or rash.  Neurological:     General: No focal deficit present.     Mental Status: He is alert and oriented to person, place, and time.     Sensory: No sensory deficit.     Motor: No weakness.  Psychiatric:        Mood and Affect: Mood normal.        Behavior:  Behavior normal.      No results found for any visits on 06/02/24. Last CBC Lab Results  Component Value Date   WBC 5.5 04/07/2024   HGB 14.6 04/07/2024   HCT 42.8 04/07/2024   MCV 86.1 04/07/2024   MCH 29.6 05/31/2023   RDW 12.5 04/07/2024   PLT 250.0 04/07/2024   Last metabolic panel Lab Results  Component Value Date   GLUCOSE 87 04/07/2024   NA 140 04/07/2024   K 4.4 04/07/2024   CL 103 04/07/2024   CO2 29 04/07/2024   BUN 11 04/07/2024   CREATININE 1.27 04/07/2024   GFR 66.68 04/07/2024   CALCIUM 9.1 04/07/2024   PROT 6.8 04/07/2024   ALBUMIN 4.1 04/07/2024   BILITOT 0.5 04/07/2024   ALKPHOS 65 04/07/2024   AST 18 04/07/2024   ALT 12 04/07/2024   Last lipids Lab Results  Component Value Date   CHOL 151 02/14/2023   HDL 73 02/14/2023   LDLCALC 66 02/14/2023   TRIG 42 02/14/2023   CHOLHDL 2.1 02/14/2023   Last hemoglobin A1c Lab Results  Component Value Date   HGBA1C 5.2 02/14/2023   Last thyroid  functions Lab Results  Component Value Date   TSH 1.44 02/14/2023   Last vitamin D  Lab Results  Component Value Date   VD25OH 55 02/14/2023   Last vitamin B12 and Folate Lab Results  Component Value Date   VITAMINB12 589 02/14/2023        Assessment & Plan:    Routine Health Maintenance and Physical Exam  Immunization History  Administered Date(s) Administered   Tdap 11/02/2021    Health Maintenance  Topic Date Due   Hepatitis B Vaccines 19-59 Average Risk (1 of 3 - 19+ 3-dose series) Never done  Influenza Vaccine  Never done   COVID-19 Vaccine (1) 10/09/2024 (Originally 06/28/1981)   Colonoscopy  02/16/2028   DTaP/Tdap/Td (2 - Td or Tdap) 11/03/2031   Hepatitis C Screening  Completed   Pneumococcal Vaccine  Aged Out   HPV VACCINES  Aged Out   Meningococcal B Vaccine  Aged Out   HIV Screening  Discontinued    Discussed health benefits of physical activity, and encouraged him to engage in regular exercise appropriate for his age and  condition.  Problem List Items Addressed This Visit   None Visit Diagnoses       Encounter for routine adult health examination without abnormal findings    -  Primary   Relevant Orders   CBC with Differential/Platelet   Hemoglobin A1c   TSH   Lipid panel   Comprehensive metabolic panel with GFR   PSA     History of Clostridium difficile infection       Relevant Orders   Ova and parasite examination   Clostridium Difficile by PCR   GI Profile, Stool, PCR     Abdominal distention       Relevant Orders   Ova and parasite examination   Clostridium Difficile by PCR   H. pylori breath test   GI Profile, Stool, PCR      No follow-ups on file. Assessment and Plan    Clostridioides difficile infection with persistent gastrointestinal symptoms Persistent GI symptoms post-C. difficile treatment. Differential includes H. pylori infection. - Order H. pylori breath test. - Provide stool sample kit for repeat stool studies. - Order Ova and Parasite test.  Anxiety disorder Managed with fluvoxamine 50 mg. Morning symptoms may relate to medication.  General Health Maintenance Up to date except vision check. Declined flu vaccine. Reduced activity and poor diet post-illness. - Encourage vision specialist visit. - Discuss importance of physical activity and balanced diet.          Benton LITTIE Gave, PA

## 2024-06-03 ENCOUNTER — Ambulatory Visit: Payer: Self-pay | Admitting: Urgent Care

## 2024-06-03 LAB — CBC WITH DIFFERENTIAL/PLATELET
Basophils Absolute: 0.1 x10E3/uL (ref 0.0–0.2)
Basos: 1 %
EOS (ABSOLUTE): 0.3 x10E3/uL (ref 0.0–0.4)
Eos: 4 %
Hematocrit: 44.3 % (ref 37.5–51.0)
Hemoglobin: 15.1 g/dL (ref 13.0–17.7)
Immature Grans (Abs): 0 x10E3/uL (ref 0.0–0.1)
Immature Granulocytes: 0 %
Lymphocytes Absolute: 2.1 x10E3/uL (ref 0.7–3.1)
Lymphs: 28 %
MCH: 30.2 pg (ref 26.6–33.0)
MCHC: 34.1 g/dL (ref 31.5–35.7)
MCV: 89 fL (ref 79–97)
Monocytes Absolute: 0.7 x10E3/uL (ref 0.1–0.9)
Monocytes: 9 %
Neutrophils Absolute: 4.3 x10E3/uL (ref 1.4–7.0)
Neutrophils: 57 %
Platelets: 281 x10E3/uL (ref 150–450)
RBC: 5 x10E6/uL (ref 4.14–5.80)
RDW: 12.5 % (ref 11.6–15.4)
WBC: 7.5 x10E3/uL (ref 3.4–10.8)

## 2024-06-03 LAB — COMPREHENSIVE METABOLIC PANEL WITH GFR
ALT: 17 IU/L (ref 0–44)
AST: 21 IU/L (ref 0–40)
Albumin: 4.5 g/dL (ref 4.1–5.1)
Alkaline Phosphatase: 94 IU/L (ref 47–123)
BUN/Creatinine Ratio: 16 (ref 9–20)
BUN: 19 mg/dL (ref 6–24)
Bilirubin Total: 0.3 mg/dL (ref 0.0–1.2)
CO2: 24 mmol/L (ref 20–29)
Calcium: 9.5 mg/dL (ref 8.7–10.2)
Chloride: 101 mmol/L (ref 96–106)
Creatinine, Ser: 1.18 mg/dL (ref 0.76–1.27)
Globulin, Total: 2.2 g/dL (ref 1.5–4.5)
Glucose: 86 mg/dL (ref 70–99)
Potassium: 4.7 mmol/L (ref 3.5–5.2)
Sodium: 139 mmol/L (ref 134–144)
Total Protein: 6.7 g/dL (ref 6.0–8.5)
eGFR: 76 mL/min/1.73 (ref >59)

## 2024-06-03 LAB — TSH: TSH: 1.12 u[IU]/mL (ref 0.450–4.500)

## 2024-06-03 LAB — LIPID PANEL
Chol/HDL Ratio: 2.2 ratio (ref 0.0–5.0)
Cholesterol, Total: 178 mg/dL (ref 100–199)
HDL: 81 mg/dL (ref >39)
LDL Chol Calc (NIH): 71 mg/dL (ref 0–99)
Triglycerides: 160 mg/dL — ABNORMAL HIGH (ref 0–149)
VLDL Cholesterol Cal: 26 mg/dL (ref 5–40)

## 2024-06-03 LAB — HEMOGLOBIN A1C
Est. average glucose Bld gHb Est-mCnc: 94 mg/dL
Hgb A1c MFr Bld: 4.9 % (ref 4.8–5.6)

## 2024-06-03 LAB — PSA: Prostate Specific Ag, Serum: 0.2 ng/mL (ref 0.0–4.0)

## 2024-06-03 LAB — H. PYLORI BREATH TEST: H pylori Breath Test: NEGATIVE

## 2024-06-03 NOTE — Patient Instructions (Signed)
 We completed your annual physical today with labs Will also repeat stool studies.  Please return samples as soon as possible.  Continue all medications as currently ordered.

## 2024-08-12 ENCOUNTER — Encounter: Payer: Self-pay | Admitting: Family Medicine

## 2025-06-02 ENCOUNTER — Encounter: Admitting: Urgent Care
